# Patient Record
Sex: Female | Born: 1975 | Hispanic: Yes | Marital: Married | Smoking: Never smoker
Health system: Southern US, Community
[De-identification: ages and names within clinical notes are randomized; demographics above are authoritative.]

## PROBLEM LIST (undated history)

## (undated) DIAGNOSIS — J45909 Unspecified asthma, uncomplicated: Secondary | ICD-10-CM

## (undated) DIAGNOSIS — G43909 Migraine, unspecified, not intractable, without status migrainosus: Secondary | ICD-10-CM

## (undated) DIAGNOSIS — Z789 Other specified health status: Secondary | ICD-10-CM

## (undated) HISTORY — PX: CHOLECYSTECTOMY: SHX55

## (undated) HISTORY — DX: Migraine, unspecified, not intractable, without status migrainosus: G43.909

---
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
## 2004-03-27 ENCOUNTER — Emergency Department (HOSPITAL_COMMUNITY): Admission: EM | Admit: 2004-03-27 | Discharge: 2004-03-28 | Payer: Self-pay | Admitting: Emergency Medicine

## 2004-06-30 ENCOUNTER — Emergency Department (HOSPITAL_COMMUNITY): Admission: EM | Admit: 2004-06-30 | Discharge: 2004-06-30 | Payer: Self-pay | Admitting: Emergency Medicine

## 2004-07-05 ENCOUNTER — Emergency Department (HOSPITAL_COMMUNITY): Admission: EM | Admit: 2004-07-05 | Discharge: 2004-07-05 | Payer: Self-pay | Admitting: Emergency Medicine

## 2004-08-01 ENCOUNTER — Ambulatory Visit (HOSPITAL_COMMUNITY): Admission: RE | Admit: 2004-08-01 | Discharge: 2004-08-01 | Payer: Self-pay | Admitting: Internal Medicine

## 2004-08-02 ENCOUNTER — Ambulatory Visit (HOSPITAL_COMMUNITY): Admission: RE | Admit: 2004-08-02 | Discharge: 2004-08-02 | Payer: Self-pay | Admitting: Internal Medicine

## 2004-08-15 ENCOUNTER — Ambulatory Visit (HOSPITAL_COMMUNITY): Admission: RE | Admit: 2004-08-15 | Discharge: 2004-08-15 | Payer: Self-pay | Admitting: General Surgery

## 2009-09-27 ENCOUNTER — Ambulatory Visit (HOSPITAL_COMMUNITY): Admission: RE | Admit: 2009-09-27 | Discharge: 2009-09-27 | Payer: Self-pay | Admitting: Obstetrics and Gynecology

## 2011-05-17 NOTE — Op Note (Signed)
NAME:  [name], [name]                         ACCOUNT NO.:  [id number]   MEDICAL RECORD NO.:  [id number]                   PATIENT TYPE:  AMB   LOCATION:  DAY                                  FACILITY:  [facility]   PHYSICIAN:  Dalia Heading, M.D.               DATE OF BIRTH:  [birth year]   DATE OF PROCEDURE:  08/15/2004  DATE OF DISCHARGE:                                 OPERATIVE REPORT   PREOPERATIVE DIAGNOSIS:  Cholecystitis, cholelithiasis.   POSTOPERATIVE DIAGNOSIS:  Cholecystitis, cholelithiasis.   PROCEDURE:  Laparoscopic cholecystectomy.   SURGEON:  Dalia Heading, M.D.   ANESTHESIA:  General endotracheal.   INDICATIONS FOR PROCEDURE:  The patient is a 35 year old Hispanic female who  presents with cholecystitis secondary to cholelithiasis.  The risks and  benefits of the procedure, including bleeding, infection, hepatobiliary  injury, and the possibility of an open procedure were fully explained to the  patient who gave informed consent.   DESCRIPTION OF PROCEDURE:  The patient was placed in the supine position.  After induction of general endotracheal anesthesia, the abdomen was prepped  and draped using the usual sterile technique with Betadine.  Surgical site  confirmation was performed.   An infraumbilical incision was made down to the fascia.  A Veress needle was  introduced into the abdominal cavity, and confirmation of placement was done  using the saline drop test.  The abdomen was then insufflated to 16 mmHg  pressure.  An 11-mm trocar was introduced into the abdominal cavity under  direct visualization without difficulty.  The patient was placed in reverse  Trendelenburg position, and an additional 11-mm trocar was placed in the  epigastric region and 5-mm trocars were placed in the right upper quadrant  and right flank regions.  The liver was inspected and noted to be within  normal limits.  The gallbladder was retracted superiorly and laterally.  The  dissection was begun around the infundibulum of the gallbladder.  The cystic  duct was first identified.  Its juncture to the infundibulum was fully  identified.  Endoclips were placed proximally and distally on the cystic  duct, and the cystic duct was divided.  This was likewise done on the cystic  artery.  The gallbladder was then freed away from the gallbladder fossa  using Bovie electrocautery.  The gallbladder was delivered through the  epigastric trocar site using an EndoCatch bag.  The gallbladder fossa was  inspected, and no abnormal bleeding or bile leakage was noted.  Surgicel was  placed in the gallbladder fossa.  All fluid and air were then evacuated from  the abdominal cavity prior to removal of the trocars.   All wounds were irrigated with normal saline.  All wounds were injected with  0.5% Sensorcaine.  The infraumbilical fascia was reapproximated using an 0  Vicryl interrupted suture.  All skin incisions were closed using staples.  Betadine ointment and dry  sterile dressings were applied.   All tape and needle counts were correct at the end of the procedure.  The  patient was extubated in the operating room and went back to the recovery  room awake and in stable condition.   COMPLICATIONS:  None.   SPECIMENS:  Gallbladder with stones.   ESTIMATED BLOOD LOSS:  Minimal.      ___________________________________________                                            Dalia Heading, M.D.   MAJ/MEDQ  D:  08/15/2004  T:  08/15/2004  Job:  [id number]   cc:   Lionel December, M.D.  [address]  [place]  [place] [address]  Fax: [phone]

## 2011-05-17 NOTE — Op Note (Signed)
NAME:  [name], [name]                         ACCOUNT NO.:  [id number]   MEDICAL RECORD NO.:  [id number]                   PATIENT TYPE:  AMB   LOCATION:  DAY                                  FACILITY:  [facility]   PHYSICIAN:  Lionel December, M.D.                 DATE OF BIRTH:  [birth year]   DATE OF PROCEDURE:  08/01/2004  DATE OF DISCHARGE:                                 OPERATIVE REPORT   PROCEDURE:  Esophagogastroduodenoscopy.   INDICATIONS FOR PROCEDURE:  [name] is a 35 year old Hispanic female with a  four-month history of intermittent epigastric and retrosternal chest pain  who has not responded to PPI.  She has been to the ER multiple times and has  had negative ECGs and troponin levels.  She is undergoing diagnostic EGD.  The procedure risks were reviewed with the patient.  The patient does not  speak English; therefore, her husband helped with the translation.   PREOPERATIVE MEDICATIONS:  Cetacaine spray for pharyngeal topical  anesthesia, Demerol 25 mg IV, Versed 5 mg IV.   FINDINGS:  The procedure was performed in the endoscopy suite.  The  patient's vital signs and O2 saturations were monitored during the procedure  and remained stable.  The patient was placed in the left lateral recumbent  position, and the Olympus videoscope was passed via the oropharynx without  any difficulty into the esophagus.   Esophagus:  The mucosa of the esophagus was normal.  The gastroesophageal  junction was unremarkable.   Stomach:  It was empty and distended very well with insufflation.  There was  patchy erythema at the body and antrum, but no erosions or ulcers were  noted.  The pyloric channel was patent.  The angularis, fundus, and cardia  were examined by retroflexing the scope and were normal.   Duodenum:  Examination of the bulb revealed normal mucosa.  The scope was  passed to the second part of the duodenum where the mucosa and folds were  normal.  The endoscope was withdrawn.  The  patient tolerated the procedure  well.   FINAL DIAGNOSIS:  Nonerosive gastritis which possibly has nothing to do with  her pain.   PLAN:  1. Will check on the results of LFTs which are still pending  2. H pylori serology.  3. Levsin/SL t.i.d. p.r.n.  4. Upper abdominal ultrasound in the near future.      ___________________________________________                                            Lionel December, M.D.   NR/MEDQ  D:  08/01/2004  T:  08/02/2004  Job:  [id number]   cc:   [facility]

## 2011-05-17 NOTE — H&P (Signed)
NAME:  [name], [name]                         ACCOUNT NO.:  [id number]   MEDICAL RECORD NO.:  [id number]                   PATIENT TYPE:  OUT   LOCATION:  RAD                                  FACILITY:  [facility]   PHYSICIAN:  Dalia Heading, M.D.               DATE OF BIRTH:  [birth year]   DATE OF ADMISSION:  08/02/2004  DATE OF DISCHARGE:  08/02/2004                                HISTORY & PHYSICAL   CHIEF COMPLAINT:  Biliary colic, cholelithiasis.   HISTORY OF PRESENT ILLNESS:  This patient is a 35 year old Hispanic female  who was referred for evaluation and treatment of biliary colic secondary to  cholelithiasis.  She has been having intermittent episodes of right upper  quadrant abdominal pain with radiation to the right flank, nausea, and  bloating for several weeks.  She does have fatty food intolerance.  No  fever, chills, or jaundice have been noted.  The symptoms seem to be  worsening.   PAST MEDICAL HISTORY:  Unremarkable.   PAST SURGICAL HISTORY:  Unremarkable.   CURRENT MEDICATIONS:  Hyoscyamine.   ALLERGIES:  DARVOCET.   REVIEW OF SYSTEMS:  Noncontributory.   PHYSICAL EXAMINATION:  GENERAL:  The patient is a well-developed, well-  nourished Hispanic female in no acute distress.  VITAL SIGNS:  She is afebrile, and vital signs are stable.  HEENT:  No scleral icterus.  LUNGS:  Clear to auscultation with equal breath sounds bilaterally.  HEART:  Regular rate and rhythm without S3, S4, or murmurs.  ABDOMEN:  Soft with tenderness noted in the right upper quadrant to  palpation.  No hepatosplenomegaly, masses, or hernias are identified.  Ultrasound of the gallbladder reveals cholelithiasis with normal common bile  duct.   IMPRESSION:  Cholecystitis, cholelithiasis.   PLAN:  The patient is scheduled to undergo laparoscopic cholecystectomy on  08/15/2004.  The risks and benefits of the procedure, including bleeding,  infection, hepatobiliary injury, and the possibility  of an open procedure  were fully explained to the patient through her husband who was then  interpreter for the patient, who gave informed consent.     ___________________________________________                                         Dalia Heading, M.D.   MAJ/MEDQ  D:  08/09/2004  T:  08/09/2004  Job:  [id number]   cc:   Lionel December, M.D.  [address]  [place]  [place] [address]  Fax: [phone]

## 2011-05-17 NOTE — Consult Note (Signed)
[name], [name]                           ACCOUNT NO.:  [id number]   MEDICAL RECORD NO.:  [id number]                  PATIENT TYPE:   LOCATION:                                       FACILITY:   PHYSICIAN:  Lionel December, M.D.                 DATE OF BIRTH:  1976/04/07   DATE OF CONSULTATION:  07/27/2004  DATE OF DISCHARGE:                                   CONSULTATION   REFERRING PHYSICIAN:  [facility].   REASON FOR CONSULTATION:  Abdominal pain.   HISTORY OF PRESENT ILLNESS:  [name] is a 35 year old Hispanic female who  presents today at the request of [facility]  regarding abdominal pain.  She is accompanied by her husband, who translates  for her.  She has had epigastric pain over the last three to four months.  She has a burning type pain in her epigastrium, and this radiates up into  her chest and up into her ears and throat.  She also notes pain in her upper  back when this occurs.  This occurs especially with eating fruits,  vegetables, greasy foods and milk.  She has been woken up at night with the  pain.  The pain occurs postprandially.  She states that she has passed some  black stools, but no fresh blood.  Her bowels tend to go from diarrhea to  constipation.  She denies any hematuria or dysuria.  Last menstrual cycle  three weeks ago.  She states that she has been to the [facility] on  several occasions regarding these symptoms.  She has been to the emergency  department at [facility] on two occasions and once to [place].  Most  recently, at [facility], she was seen.  She denies having any x-rays  performed.  She had a MET-7 which revealed a slightly low potassium of 3.4.  CBC was normal.  She had CK MB, troponin, and myoglobin, which were all  normal as well.  She was Hemoccult negative on rectal examination.  The  patient has been on Aciphex for several months.  She was recently given a GI  cocktail.  She  states that this really has not helped her symptoms.  She  denies any NSAID or aspirin use recently, but was on ibuprofen back in March  for a musculoskeletal problem.   CURRENT MEDICATIONS:  None.   ALLERGIES:  HYDROCODONE, causes rash.   PAST MEDICAL HISTORY:  History of hypercholesterolemia, but not on any  medication.   FAMILY HISTORY:  Negative for chronic GI illnesses or colorectal cancer.   SOCIAL HISTORY:  She has been married for four years and has two children.  She is a housewife.  She has never been a smoker.  She denies any alcohol  use.   REVIEW OF SYSTEMS:  Please see HPI for GI, for GU.  CARDIOPULMONARY:  She  denies  any shortness of breath or diaphoresis along with this pain.   PHYSICAL EXAMINATION:  VITAL SIGNS:  Weight 140.5; height 5 feet 1 inch;  blood pressure 100/60; pulse 70.  GENERAL:  Pleasant, well-nourished, well-developed, Hispanic female in no  acute distress.  SKIN:  Warm and dry.  No jaundice.  HEENT:  Pupils equal, round and reactive to light.  Conjunctivae are pink.  Sclerae are nonicteric.  Oropharyngeal mucosa moist and pink.  No lesions,  erythema or exudate.  No lymphadenopathy, thyromegaly.  CHEST:  Lungs are clear to auscultation.  CARDIOVASCULAR:  Regular rate and rhythm, normal S1, S2, no murmurs, rubs or  gallops.  ABDOMEN:  Positive bowel sounds, soft, nondistended.  She has mild  epigastric tenderness to deep palpation.  No organomegaly or masses.  EXTREMITIES:  No edema.   IMPRESSION:  [name] is a pleasant 35 year old Hispanic female who presents  with a four month history of epigastric pain along with reflux type  symptoms.  She reports melena.  However, has been Hemoccult negative by  rectal examination in the emergency department.  Hemoglobin was normal at  that time as well.  Given her symptoms, would suspect that she most likely  has poorly controlled reflux disease or gastritis.  We cannot rule out  peptic ulcer disease or  even biliary etiology, however.   PLAN:  1. EGD in the near future.  2. Protonix 40 mg p.o. q.d.; two weeks of samples given.  3. We will obtain a set of LFTs at the time of endoscopy.     ____________________  Lionel December, M.D.     ____________________  Tana Coast, P.A.     ________________________________________  ___________________________________________  Tana Coast, P.A.                         Lionel December, M.D.   LL/MEDQ  D:  07/27/2004  T:  07/27/2004  Job:  [id number]   cc:   Lionel December, M.D.  [address]  [place]  [place] [address]  Fax: [phone]   [facility] Department

## 2011-09-25 ENCOUNTER — Ambulatory Visit
Admission: RE | Admit: 2011-09-25 | Discharge: 2011-09-25 | Disposition: A | Discharge status: Home / Self Care | Payer: No Typology Code available for payment source | Source: Ambulatory Visit | Attending: Geriatric Medicine | Admitting: Geriatric Medicine

## 2011-09-25 ENCOUNTER — Other Ambulatory Visit: Payer: Self-pay | Admitting: Geriatric Medicine

## 2011-09-25 DIAGNOSIS — R52 Pain, unspecified: Secondary | ICD-10-CM

## 2015-02-14 ENCOUNTER — Encounter (HOSPITAL_BASED_OUTPATIENT_CLINIC_OR_DEPARTMENT_OTHER): Payer: Self-pay | Admitting: Emergency Medicine

## 2015-02-14 DIAGNOSIS — Z9049 Acquired absence of other specified parts of digestive tract: Secondary | ICD-10-CM | POA: Insufficient documentation

## 2015-02-14 DIAGNOSIS — E876 Hypokalemia: Secondary | ICD-10-CM | POA: Insufficient documentation

## 2015-02-14 DIAGNOSIS — R109 Unspecified abdominal pain: Secondary | ICD-10-CM | POA: Insufficient documentation

## 2015-02-14 DIAGNOSIS — Z3202 Encounter for pregnancy test, result negative: Secondary | ICD-10-CM | POA: Insufficient documentation

## 2015-02-14 DIAGNOSIS — R197 Diarrhea, unspecified: Secondary | ICD-10-CM | POA: Insufficient documentation

## 2015-02-14 DIAGNOSIS — R112 Nausea with vomiting, unspecified: Secondary | ICD-10-CM | POA: Insufficient documentation

## 2015-02-14 DIAGNOSIS — F419 Anxiety disorder, unspecified: Secondary | ICD-10-CM | POA: Insufficient documentation

## 2015-02-14 DIAGNOSIS — R1912 Hyperactive bowel sounds: Secondary | ICD-10-CM | POA: Insufficient documentation

## 2015-02-14 NOTE — ED Notes (Signed)
Husband states that pt is having V/D since yesterday and started having SOB and can't feel her right arm.  Pt able to squeeze hands and states she feels me touching it.

## 2015-02-15 ENCOUNTER — Emergency Department (HOSPITAL_BASED_OUTPATIENT_CLINIC_OR_DEPARTMENT_OTHER)
Admission: EM | Admit: 2015-02-15 | Discharge: 2015-02-15 | Disposition: A | Discharge status: Home / Self Care | Payer: Self-pay | Attending: Emergency Medicine | Admitting: Emergency Medicine

## 2015-02-15 ENCOUNTER — Encounter (HOSPITAL_BASED_OUTPATIENT_CLINIC_OR_DEPARTMENT_OTHER): Payer: Self-pay | Admitting: Emergency Medicine

## 2015-02-15 ENCOUNTER — Emergency Department (HOSPITAL_BASED_OUTPATIENT_CLINIC_OR_DEPARTMENT_OTHER): Payer: Self-pay

## 2015-02-15 DIAGNOSIS — R112 Nausea with vomiting, unspecified: Secondary | ICD-10-CM

## 2015-02-15 DIAGNOSIS — E876 Hypokalemia: Secondary | ICD-10-CM

## 2015-02-15 DIAGNOSIS — R197 Diarrhea, unspecified: Secondary | ICD-10-CM

## 2015-02-15 DIAGNOSIS — R52 Pain, unspecified: Secondary | ICD-10-CM

## 2015-02-15 LAB — COMPREHENSIVE METABOLIC PANEL
ALT: 30 U/L (ref 0–35)
AST: 36 U/L (ref 0–37)
Albumin: 4.2 g/dL (ref 3.5–5.2)
Alkaline Phosphatase: 63 U/L (ref 39–117)
Anion gap: 4 — ABNORMAL LOW (ref 5–15)
BUN: 17 mg/dL (ref 6–23)
CO2: 21 mmol/L (ref 19–32)
Calcium: 8.3 mg/dL — ABNORMAL LOW (ref 8.4–10.5)
Chloride: 107 mmol/L (ref 96–112)
Creatinine, Ser: 0.4 mg/dL — ABNORMAL LOW (ref 0.50–1.10)
GFR calc Af Amer: >90 mL/min (ref >90)
GFR calc non Af Amer: >90 mL/min (ref >90)
Glucose, Bld: 136 mg/dL — ABNORMAL HIGH (ref 70–99)
Potassium: 2.9 mmol/L — ABNORMAL LOW (ref 3.5–5.1)
Sodium: 132 mmol/L — ABNORMAL LOW (ref 135–145)
Total Bilirubin: 1 mg/dL (ref 0.3–1.2)
Total Protein: 7.4 g/dL (ref 6.0–8.3)

## 2015-02-15 LAB — URINALYSIS, ROUTINE W REFLEX MICROSCOPIC
Glucose, UA: NEGATIVE mg/dL
Hgb urine dipstick: NEGATIVE
Ketones, ur: 15 mg/dL — AB
Nitrite: NEGATIVE
Protein, ur: NEGATIVE mg/dL
Specific Gravity, Urine: 1.03 (ref 1.005–1.030)
Urobilinogen, UA: 1 mg/dL (ref 0.0–1.0)
pH: 6 (ref 5.0–8.0)

## 2015-02-15 LAB — CBC WITH DIFFERENTIAL/PLATELET
Basophils Absolute: 0 10*3/uL (ref 0.0–0.1)
Basophils Relative: 0 % (ref 0–1)
Eosinophils Absolute: 0 10*3/uL (ref 0.0–0.7)
Eosinophils Relative: 1 % (ref 0–5)
HCT: 40.7 % (ref 36.0–46.0)
Hemoglobin: 13.7 g/dL (ref 12.0–15.0)
Lymphocytes Relative: 13 % (ref 12–46)
Lymphs Abs: 0.8 10*3/uL (ref 0.7–4.0)
MCH: 31.1 pg (ref 26.0–34.0)
MCHC: 33.7 g/dL (ref 30.0–36.0)
MCV: 92.5 fL (ref 78.0–100.0)
Monocytes Absolute: 0.5 10*3/uL (ref 0.1–1.0)
Monocytes Relative: 7 % (ref 3–12)
Neutro Abs: 5 10*3/uL (ref 1.7–7.7)
Neutrophils Relative %: 79 % — ABNORMAL HIGH (ref 43–77)
Platelets: 184 10*3/uL (ref 150–400)
RBC: 4.4 MIL/uL (ref 3.87–5.11)
RDW: 13.3 % (ref 11.5–15.5)
WBC: 6.3 10*3/uL (ref 4.0–10.5)

## 2015-02-15 LAB — LIPASE, BLOOD: Lipase: 25 U/L (ref 11–59)

## 2015-02-15 LAB — URINE MICROSCOPIC-ADD ON

## 2015-02-15 LAB — PREGNANCY, URINE: Preg Test, Ur: NEGATIVE

## 2015-02-15 MED ORDER — IOHEXOL 300 MG/ML  SOLN
25.0000 mL | Freq: Once | INTRAMUSCULAR | Status: AC | PRN
  Administered 2015-02-15: 25 mL via ORAL

## 2015-02-15 MED ORDER — IOHEXOL 300 MG/ML  SOLN
100.0000 mL | Freq: Once | INTRAMUSCULAR | Status: AC | PRN
  Administered 2015-02-15: 100 mL via INTRAVENOUS

## 2015-02-15 MED ORDER — KETOROLAC TROMETHAMINE 30 MG/ML IJ SOLN
30.0000 mg | Freq: Once | INTRAMUSCULAR | Status: AC
  Administered 2015-02-15: 30 mg via INTRAVENOUS
  Filled 2015-02-15: qty 1

## 2015-02-15 MED ORDER — SODIUM CHLORIDE 0.9 % IV SOLN
Freq: Once | INTRAVENOUS | Status: AC
  Administered 2015-02-15: 02:00:00 via INTRAVENOUS

## 2015-02-15 MED ORDER — GI COCKTAIL ~~LOC~~
30.0000 mL | Freq: Once | ORAL | Status: AC
  Administered 2015-02-15: 30 mL via ORAL
  Filled 2015-02-15: qty 30

## 2015-02-15 MED ORDER — SODIUM CHLORIDE 0.9 % IV BOLUS (SEPSIS)
1000.0000 mL | Freq: Once | INTRAVENOUS | Status: AC
  Administered 2015-02-15: 1000 mL via INTRAVENOUS

## 2015-02-15 MED ORDER — POTASSIUM CHLORIDE CRYS ER 20 MEQ PO TBCR
60.0000 meq | EXTENDED_RELEASE_TABLET | Freq: Once | ORAL | Status: AC
Start: 2015-02-15 — End: 2015-02-15
  Administered 2015-02-15: 60 meq via ORAL
  Filled 2015-02-15: qty 3

## 2015-02-15 MED ORDER — PROMETHAZINE HCL 12.5 MG RE SUPP: 12.5000 mg | Freq: Three times a day (TID) | RECTAL | Status: DC | PRN

## 2015-02-15 MED ORDER — POTASSIUM CHLORIDE 10 MEQ/100ML IV SOLN
10.0000 meq | INTRAVENOUS | Status: AC
  Administered 2015-02-15 (×2): 10 meq via INTRAVENOUS
  Filled 2015-02-15 (×2): qty 100

## 2015-02-15 MED ORDER — ONDANSETRON HCL 4 MG/2ML IJ SOLN
INTRAMUSCULAR | Status: AC
Start: 2015-02-15 — End: 2015-02-15
  Administered 2015-02-15: 4 mg
  Filled 2015-02-15: qty 2

## 2015-02-15 MED ORDER — POTASSIUM CHLORIDE CRYS ER 20 MEQ PO TBCR: 20.0000 meq | EXTENDED_RELEASE_TABLET | Freq: Two times a day (BID) | ORAL | Status: DC

## 2015-02-15 NOTE — ED Provider Notes (Signed)
CSN: [id number]     Arrival date & time 02/14/15  2028 History   First MD Initiated Contact with Patient 02/15/15 0032     Chief Complaint  Patient presents with  . Emesis  . Diarrhea  . Shortness of Breath     (Consider location/radiation/quality/duration/timing/severity/associated sxs/prior Treatment) Patient is a 39 y.o. female presenting with vomiting. The history is provided by the patient.  Emesis Severity:  Severe Duration:  1 day Timing:  Intermittent Quality:  Stomach contents Progression:  Unchanged Chronicity:  New Recent urination:  Normal Relieved by:  Nothing Worsened by:  Nothing tried Ineffective treatments:  None tried Associated symptoms: abdominal pain and diarrhea   Diarrhea:    Quality:  Watery   Severity:  Moderate   Timing:  Intermittent   Progression:  Unchanged Risk factors: sick contacts   Risk factors: no diabetes   epigastric pain, and hand cramping and panic attack  History reviewed. No pertinent past medical history. Past Surgical History  Procedure Laterality Date  . Cholecystectomy     History reviewed. No pertinent family history. History  Substance Use Topics  . Smoking status: Never Smoker   . Smokeless tobacco: Not on file  . Alcohol Use: No   OB History    No data available     Review of Systems  Constitutional: Negative for fever.  Respiratory: Negative for shortness of breath.   Cardiovascular: Negative for chest pain, palpitations and leg swelling.  Gastrointestinal: Positive for nausea, vomiting, abdominal pain and diarrhea.  All other systems reviewed and are negative.     Allergies  Hydrocodone  Home Medications   Prior to Admission medications   Not on File   BP 88/71 mmHg  Pulse 80  Temp(Src) 98.9 F (37.2 C) (Oral)  Resp 20  Ht  (1.626 m)  Wt 160 lb (72.576 kg)  BMI 27.45 kg/m2  SpO2 99%  LMP 02/11/2015 Physical Exam  Constitutional: She is oriented to person, place, and time. She appears  well-developed and well-nourished. No distress.  HENT:  Head: Normocephalic and atraumatic.  Mouth/Throat: Oropharynx is clear and moist.  Eyes: Conjunctivae are normal. Pupils are equal, round, and reactive to light.  Neck: Normal range of motion. Neck supple.  Cardiovascular: Normal rate, regular rhythm and intact distal pulses.   Pulmonary/Chest: Effort normal. No respiratory distress. She has no wheezes. She has no rales.  Abdominal: Soft. Bowel sounds are increased. There is no tenderness. There is no rigidity, no rebound, no guarding, no tenderness at McBurney's point and negative Murphy's sign.  Musculoskeletal: Normal range of motion.  Neurological: She is alert and oriented to person, place, and time.  Skin: Skin is warm and dry.  Psychiatric: Her mood appears anxious.    ED Course  Procedures (including critical care time) Labs Review Labs Reviewed  COMPREHENSIVE METABOLIC PANEL - Abnormal; Notable for the following:    Sodium 132 (*)    Potassium 2.9 (*)    Glucose, Bld 136 (*)    Creatinine, Ser 0.40 (*)    Calcium 8.3 (*)    Anion gap 4 (*)    All other components within normal limits  PREGNANCY, URINE  LIPASE, BLOOD    Imaging Review No results found.   EKG Interpretation   Date/Time:  Wednesday February 15 2015 00:58:50 EST Ventricular Rate:  73 PR Interval:  160 QRS Duration: 82 QT Interval:  440 QTC Calculation: 484 R Axis:   -5 Text Interpretation:  Normal sinus rhythm  Confirmed by [facility]  MD,  Morene AntuAPRIL ([id number]) on 02/15/2015 1:04:46 AM      MDM   Final diagnoses:  None    Results for orders placed or performed during the hospital encounter of 02/15/15  Pregnancy, urine  Result Value Ref Range   Preg Test, Ur NEGATIVE NEGATIVE  Comprehensive metabolic panel  Result Value Ref Range   Sodium 132 (L) 135 - 145 mmol/L   Potassium 2.9 (L) 3.5 - 5.1 mmol/L   Chloride 107 96 - 112 mmol/L   CO2 21 19 - 32 mmol/L   Glucose, Bld 136 (H) 70 -  99 mg/dL   BUN 17 6 - 23 mg/dL   Creatinine, Ser 6.040.40 (L) 0.50 - 1.10 mg/dL   Calcium 8.3 (L) 8.4 - 10.5 mg/dL   Total Protein 7.4 6.0 - 8.3 g/dL   Albumin 4.2 3.5 - 5.2 g/dL   AST 36 0 - 37 U/L   ALT 30 0 - 35 U/L   Alkaline Phosphatase 63 39 - 117 U/L   Total Bilirubin 1.0 0.3 - 1.2 mg/dL   GFR calc non Af Amer >90 >90 mL/min   GFR calc Af Amer >90 >90 mL/min   Anion gap 4 (L) 5 - 15  Lipase, blood  Result Value Ref Range   Lipase 25 11 - 59 U/L  Urinalysis, Routine w reflex microscopic  Result Value Ref Range   Color, Urine AMBER (A) YELLOW   APPearance CLOUDY (A) CLEAR   Specific Gravity, Urine 1.030 1.005 - 1.030   pH 6.0 5.0 - 8.0   Glucose, UA NEGATIVE NEGATIVE mg/dL   Hgb urine dipstick NEGATIVE NEGATIVE   Bilirubin Urine SMALL (A) NEGATIVE   Ketones, ur 15 (A) NEGATIVE mg/dL   Protein, ur NEGATIVE NEGATIVE mg/dL   Urobilinogen, UA 1.0 0.0 - 1.0 mg/dL   Nitrite NEGATIVE NEGATIVE   Leukocytes, UA TRACE (A) NEGATIVE  CBC with Differential/Platelet  Result Value Ref Range   WBC 6.3 4.0 - 10.5 K/uL   RBC 4.40 3.87 - 5.11 MIL/uL   Hemoglobin 13.7 12.0 - 15.0 g/dL   HCT 54.040.7 98.136.0 - 19.146.0 %   MCV 92.5 78.0 - 100.0 fL   MCH 31.1 26.0 - 34.0 pg   MCHC 33.7 30.0 - 36.0 g/dL   RDW 47.813.3 29.511.5 - 62.115.5 %   Platelets 184 150 - 400 K/uL   Neutrophils Relative % 79 (H) 43 - 77 %   Neutro Abs 5.0 1.7 - 7.7 K/uL   Lymphocytes Relative 13 12 - 46 %   Lymphs Abs 0.8 0.7 - 4.0 K/uL   Monocytes Relative 7 3 - 12 %   Monocytes Absolute 0.5 0.1 - 1.0 K/uL   Eosinophils Relative 1 0 - 5 %   Eosinophils Absolute 0.0 0.0 - 0.7 K/uL   Basophils Relative 0 0 - 1 %   Basophils Absolute 0.0 0.0 - 0.1 K/uL  Urine microscopic-add on  Result Value Ref Range   Squamous Epithelial / LPF FEW (A) RARE   WBC, UA 3-6 <3 WBC/hpf   RBC / HPF 0-2 <3 RBC/hpf   Bacteria, UA FEW (A) RARE   Urine-Other AMORPHOUS URATES/PHOSPHATES    Ct Abdomen Pelvis W Contrast  02/15/2015   CLINICAL DATA:  Nausea  vomiting and abdominal pain  EXAM: CT ABDOMEN AND PELVIS WITH CONTRAST  TECHNIQUE: Multidetector CT imaging of the abdomen and pelvis was performed using the standard protocol following bolus administration of intravenous contrast.  CONTRAST:  25mL  OMNIPAQUE IOHEXOL 300 MG/ML SOLN, OMNIPAQUE IOHEXOL 300 MG/ML SOLN  COMPARISON:  None.  FINDINGS: Lower chest: Minimal linear scarring or atelectasis in both lung bases  Hepatobiliary: Cholecystectomy.  Normal liver and bile ducts.  Pancreas: Normal  Spleen: Normal  Adrenals/Urinary Tract: The adrenals and kidneys are normal in appearance. There is no urinary calculus evident. There is no hydronephrosis or ureteral dilatation. Collecting systems and ureters appear unremarkable.  Stomach/Bowel: The stomach, small bowel and colon are unremarkable except for the presence of liquid stool throughout much of the colon. There is no obstruction. There is no extraluminal air. There is no focal inflammatory change. The appendix is normal.  Vascular/Lymphatic: The abdominal aorta is normal in caliber. There is no atherosclerotic calcification. There is no adenopathy in the abdomen or pelvis.  Other: There is no ascites.  Uterus and ovaries appear unremarkable.  Musculoskeletal: No significant abnormality.  IMPRESSION: No significant abnormality.   Electronically Signed   By: Ellery Plunk M.D.   On: 02/15/2015 05:16   Dg Abd Acute W/chest  02/15/2015   CLINICAL DATA:  Nausea vomiting diarrhea and abdominal pain  EXAM: ACUTE ABDOMEN SERIES (ABDOMEN 2 VIEW & CHEST 1 VIEW)  COMPARISON:  None.  FINDINGS: There is a generous volume of air throughout the minimally dilated small bowel, more likely nonobstructive. Stool and air is scattered throughout the colon. There is no free intraperitoneal air. Right upper quadrant surgical clips noted. The upright view of the chest is negative for acute cardiopulmonary abnormality.  IMPRESSION: Mildly prominent air-filled small bowel,  more likely nonobstructive. Enteritis may produce this appearance.   Electronically Signed   By: Ellery Plunk M.D.   On: 02/15/2015 01:56    Medications  ondansetron (ZOFRAN) 4 MG/2ML injection (4 mg  Given 02/15/15 0049)  sodium chloride 0.9 % bolus 1,000 mL (0 mLs Intravenous Stopped 02/15/15 0204)  gi cocktail (Maalox,Lidocaine,Donnatal) (30 mLs Oral Given 02/15/15 0119)  potassium chloride 10 mEq in 100 mL IVPB (0 mEq Intravenous Stopped 02/15/15 0503)  potassium chloride SA (K-DUR,KLOR-CON) CR tablet 60 mEq (60 mEq Oral Given 02/15/15 0205)  0.9 %  sodium chloride infusion ( Intravenous Stopped 02/15/15 0322)  sodium chloride 0.9 % bolus 1,000 mL (0 mLs Intravenous Stopped 02/15/15 0504)  ketorolac (TORADOL) 30 MG/ML injection 30 mg (30 mg Intravenous Given 02/15/15 0321)  iohexol (OMNIPAQUE) 300 MG/ML solution 25 mL (25 mLs Oral Contrast Given 02/15/15 0455)  iohexol (OMNIPAQUE) 300 MG/ML solution 100 mL (100 mLs Intravenous Contrast Given 02/15/15 0459)   Viral enteritis, take all potassium.  phenergan suppositories prn.  Strict return precautions   Anhelica Fowers K Adwoa Axe-Rasch, MD 02/15/15 [phone]

## 2015-08-07 ENCOUNTER — Ambulatory Visit (HOSPITAL_COMMUNITY): Payer: Self-pay

## 2015-08-09 ENCOUNTER — Other Ambulatory Visit (HOSPITAL_COMMUNITY): Payer: Self-pay | Admitting: *Deleted

## 2015-08-09 DIAGNOSIS — N6320 Unspecified lump in the left breast, unspecified quadrant: Secondary | ICD-10-CM

## 2015-08-15 ENCOUNTER — Ambulatory Visit
Admission: RE | Admit: 2015-08-15 | Discharge: 2015-08-15 | Disposition: A | Discharge status: Home / Self Care | Payer: No Typology Code available for payment source | Source: Ambulatory Visit | Attending: Obstetrics and Gynecology | Admitting: Obstetrics and Gynecology

## 2015-08-15 ENCOUNTER — Other Ambulatory Visit (HOSPITAL_COMMUNITY): Payer: Self-pay | Admitting: *Deleted

## 2015-08-15 ENCOUNTER — Ambulatory Visit (HOSPITAL_COMMUNITY)
Admission: RE | Admit: 2015-08-15 | Discharge: 2015-08-15 | Disposition: A | Discharge status: Home / Self Care | Payer: Self-pay | Source: Ambulatory Visit | Attending: Obstetrics and Gynecology | Admitting: Obstetrics and Gynecology

## 2015-08-15 ENCOUNTER — Encounter (HOSPITAL_COMMUNITY): Payer: Self-pay

## 2015-08-15 VITALS — BP 104/68 | Temp 97.6°F | Ht 64.0 in | Wt 166.0 lb

## 2015-08-15 DIAGNOSIS — N644 Mastodynia: Secondary | ICD-10-CM

## 2015-08-15 DIAGNOSIS — M79622 Pain in left upper arm: Secondary | ICD-10-CM

## 2015-08-15 DIAGNOSIS — N6320 Unspecified lump in the left breast, unspecified quadrant: Secondary | ICD-10-CM

## 2015-08-15 NOTE — Progress Notes (Signed)
CLINIC:   Breast [facility] Cervical [facility] Control [facility] Civil engineer, contracting) Clinic  REASON FOR VISIT: Well-woman exam  HISTORY OF PRESENT ILLNESS:   [name] is a 39 y.o. female who presents to the [facility] today for clinical breast exam. Interpreter, [name] was present. No history of a mammogram. Reports pain to her left axilla that radiated down to the breast. Had a left axillary lump, but no longer able to feel this. Has some pain in her left arm as well which she describes as a hot "burning" sensation. Rates pain 9/10 when moving arm. Her last pap smear was performed in July 2016 and was negative. No history of abnormal pap.   REVIEW OF SYSTEMS:   Denies nodularity, skin changes, nipple inversion, or nipple discharge bilaterally.  Denies any pelvic pain, pressure, or abnormal vaginal bleeding.   ALLERGIES: Allergies  Allergen Reactions  . Hydrocodone     MEDICATIONS: No current outpatient prescriptions on file.  PHYSICAL EXAM:   BP 104/68 mmHg  Temp(Src) 97.6 F (36.4 C) (Oral)  Ht  (1.626 m)  Wt 166 lb (75.297 kg)  BMI 28.48 kg/m2  LMP 08/11/2015 (Approximate)  General: Well-nourished, well-appearing female in no acute distress.  She is accompanied by an interpreter in clinic today.  Stoney Bang, LPN was present during physical exam for this patient.   Breasts: Bilateral breasts exposed and observed with patient standing (arms at side, arms on hips, arms on hips flexed forward, and arms over head).  No gross abnormalities including breast skin puckering or dimpling noted on observation.  Breasts symmetrical without evidence of skin redness, thickening, or peau d'orange appearance. No nipple retraction or nipple discharge noted bilaterally.  No breast nodularity palpated in bilateral breasts.. Axillary lymph nodes: No axillary lymphadenopathy bilaterally.     ASSESSMENT & PLAN:  1. Breast cancer screening: [name] has no palpable breast abnormalities on her clinical breast  exam today.  She will receive her diagnostic mammogram as scheduled.  She will be contacted by the imaging center for results of the mammogram. She was given instructions and educational materials regarding breast self-awareness. Ms. Toney Reil is aware of this plan and agrees with it.   2. Cervical cancer screening: [name] had a recent pap in July 2016. Pelvic exam was not performed today.  [name] was encouraged to ask questions and all questions were answered to her satisfaction.      Clenton Pare, DNP, AGPCNP-BC, [facility] [phone]

## 2015-11-15 NOTE — Addendum Note (Signed)
Encounter addended by: Christine P Brannock, RN on: 11/15/2015  4:36 PM<BR>     Documentation filed: Charges VN

## 2017-01-07 ENCOUNTER — Ambulatory Visit (INDEPENDENT_AMBULATORY_CARE_PROVIDER_SITE_OTHER): Payer: Self-pay | Admitting: Family Medicine

## 2017-01-07 VITALS — BP 122/78 | HR 78 | Temp 98.7°F | Resp 17 | Ht 64.0 in | Wt 185.0 lb

## 2017-01-07 DIAGNOSIS — J329 Chronic sinusitis, unspecified: Secondary | ICD-10-CM

## 2017-01-07 MED ORDER — PSEUDOEPH-BROMPHEN-DM 30-2-10 MG/5ML PO SYRP: 5.0000 mL | ORAL_SOLUTION | Freq: Four times a day (QID) | ORAL | 0 refills | Status: DC | PRN

## 2017-01-07 MED ORDER — AMOXICILLIN-POT CLAVULANATE 875-125 MG PO TABS: 1.0000 | ORAL_TABLET | Freq: Two times a day (BID) | ORAL | 0 refills | Status: DC

## 2017-01-07 NOTE — Patient Instructions (Addendum)
Start Augmentin 1 tablet twice daily with food to avoid stomach upset. Complete all medication.  Brompheniramine-pseudoephedrine-DM 30-2-10 MG/5ML.  Return for care if symptoms worsen or doesn't improve.   Inicie Augmentin 1 tableta dos veces al da con alimentos para Programme researcher, broadcasting/film/video. Completa todos los medicamentos.  Brompheniramina-pseudoefedrina-DM 30-2-10 MG / .  Regrese para recibir atencin si los sntomas empeoran o no mejoran.   IF you received an x-ray today, you will receive an invoice from [facility]. Please contact [facility] at [phone] with questions or concerns regarding your invoice.   IF you received labwork today, you will receive an invoice from [facility]. Please contact [facility] at [phone] with questions or concerns regarding your invoice.   Our billing staff will not be able to assist you with questions regarding bills from these companies.  You will be contacted with the lab results as soon as they are available. The fastest way to get your results is to activate your My Chart account. Instructions are located on the last page of this paperwork. If you have not heard from [place] regarding the results in 2 weeks, please contact this office.     Sinusitis, Adult Sinusitis is soreness and inflammation of your sinuses. Sinuses are hollow spaces in the bones around your face. They are located:  Around your eyes.  In the middle of your forehead.  Behind your nose.  In your cheekbones. Your sinuses and nasal passages are lined with a stringy fluid (mucus). Mucus normally drains out of your sinuses. When your nasal tissues get inflamed or swollen, the mucus can get trapped or blocked so air cannot flow through your sinuses. This lets bacteria, viruses, and funguses grow, and that leads to infection. Follow these instructions at home: Medicines  Take, use, or apply over-the-counter and prescription medicines only as told by  your doctor. These may include nasal sprays.  If you were prescribed an antibiotic medicine, take it as told by your doctor. Do not stop taking the antibiotic even if you start to feel better. Hydrate and Humidify  Drink enough water to keep your pee (urine) clear or pale yellow.  Use a cool mist humidifier to keep the humidity level in your home above 50%.  Breathe in steam for 10-15 minutes, 3-4 times a day or as told by your doctor. You can do this in the bathroom while a hot shower is running.  Try not to spend time in cool or dry air. Rest  Rest as much as possible.  Sleep with your head raised (elevated).  Make sure to get enough sleep each night. General instructions  Put a warm, moist washcloth on your face 3-4 times a day or as told by your doctor. This will help with discomfort.  Wash your hands often with soap and water. If there is no soap and water, use hand sanitizer.  Do not smoke. Avoid being around people who are smoking (secondhand smoke).  Keep all follow-up visits as told by your doctor. This is important. Contact a doctor if:  You have a fever.  Your symptoms get worse.  Your symptoms do not get better within 10 days. Get help right away if:  You have a very bad headache.  You cannot stop throwing up (vomiting).  You have pain or swelling around your face or eyes.  You have trouble seeing.  You feel confused.  Your neck is stiff.  You have trouble breathing. This information is not intended to replace advice  given to you by your health care provider. Make sure you discuss any questions you have with your health care provider. Document Released: 06/03/2008 Document Revised: 08/11/2016 Document Reviewed: 10/11/2015 Elsevier Interactive Patient Education  2017 [employer]

## 2017-01-07 NOTE — Progress Notes (Signed)
   Patient ID: [name], female    DOB: 1976-04-20, 41 y.o.   MRN: [id number]  PCP: No primary care provider on file.  Chief Complaint  Patient presents with  . Cough  . Sore Throat  . Back Pain  . URI    Subjective:  HPI  41 year old female presents for evaluation of upper respiratory symptoms x Started with cough ( productive) and sore throat. Headache bilateral and pain behind the ears. Shortness of breath at night while trying to sleep. Back pain related to coughing Medications taken Mucinex without relief of symptoms. She has remained afebrile.   Social History   Social History  . Marital status: Married    Spouse name: N/A  . Number of children: N/A  . Years of education: N/A   Occupational History  . Not on file.   Social History Main Topics  . Smoking status: Never Smoker  . Smokeless tobacco: Never Used  . Alcohol use No  . Drug use: No  . Sexual activity: Yes    Birth control/ protection: Condom   Other Topics Concern  . Not on file   Social History Narrative  . No narrative on file    Family History  Problem Relation Age of Onset  . Diabetes Father   . Hypertension Father      Review of Systems  There are no active problems to display for this patient.   Allergies  Allergen Reactions  . Hydrocodone     Prior to Admission medications   Medication Sig Start Date End Date Taking? Authorizing Provider  dextromethorphan-guaiFENesin (MUCINEX DM) 30-600 MG 12hr tablet Take 1 tablet by mouth 2 (two) times daily.   Yes Historical Provider, MD    Past Medical, Surgical Family and Social History reviewed and updated.    Objective:   Today's Vitals   01/07/17 1017  BP: 122/78  Pulse: 78  Resp: 17  Temp: 98.7 F (37.1 C)  TempSrc: Oral  SpO2: 97%  Weight: 185 lb (83.9 kg)  Height: 5\' 4"  (1.626 m)    Wt Readings from Last 3 Encounters:  01/07/17 185 lb (83.9 kg)  08/15/15 166 lb (75.3 kg)   Physical Exam  Constitutional:  She is oriented to person, place, and time. She appears well-developed and well-nourished.  HENT:  Head: Normocephalic and atraumatic.  Right Ear: Hearing, tympanic membrane, external ear and ear canal normal.  Left Ear: Hearing, tympanic membrane, external ear and ear canal normal.  Nose: Mucosal edema and rhinorrhea present.  Mouth/Throat: Uvula is midline. No oropharyngeal exudate, posterior oropharyngeal edema or posterior oropharyngeal erythema.  Neck: Normal range of motion. Neck supple.  Cardiovascular: Normal rate, regular rhythm and normal heart sounds.   Pulmonary/Chest: Effort normal and breath sounds normal.  Lymphadenopathy:    She has no cervical adenopathy.  Neurological: She is alert and oriented to person, place, and time.  Skin: Skin is warm and dry.  Psychiatric: She has a normal mood and affect. Her behavior is normal. Judgment and thought content normal.     Assessment & Plan:  1. Sinusitis, unspecified chronicity, unspecified location -Augmentin 875-125 mg one tablet twice daily x 10 days. -brompheniramine-pseudoephedrine-DM 30-2-10 MG/5ML   Return for care if symptoms worsen or do not improve.  Godfrey PickKimberly S. Tiburcio PeaHarris, MSN, FNP-C [facility] at [facility] [phone]

## 2017-01-15 ENCOUNTER — Encounter (HOSPITAL_BASED_OUTPATIENT_CLINIC_OR_DEPARTMENT_OTHER): Payer: Self-pay | Admitting: Emergency Medicine

## 2017-01-15 ENCOUNTER — Emergency Department (HOSPITAL_BASED_OUTPATIENT_CLINIC_OR_DEPARTMENT_OTHER): Payer: Self-pay

## 2017-01-15 ENCOUNTER — Emergency Department (HOSPITAL_BASED_OUTPATIENT_CLINIC_OR_DEPARTMENT_OTHER)
Admission: EM | Admit: 2017-01-15 | Discharge: 2017-01-15 | Disposition: A | Discharge status: Home / Self Care | Payer: Self-pay | Attending: Emergency Medicine | Admitting: Emergency Medicine

## 2017-01-15 DIAGNOSIS — B9789 Other viral agents as the cause of diseases classified elsewhere: Secondary | ICD-10-CM

## 2017-01-15 DIAGNOSIS — M549 Dorsalgia, unspecified: Secondary | ICD-10-CM | POA: Insufficient documentation

## 2017-01-15 DIAGNOSIS — Z79899 Other long term (current) drug therapy: Secondary | ICD-10-CM | POA: Insufficient documentation

## 2017-01-15 DIAGNOSIS — J40 Bronchitis, not specified as acute or chronic: Secondary | ICD-10-CM | POA: Insufficient documentation

## 2017-01-15 DIAGNOSIS — J069 Acute upper respiratory infection, unspecified: Secondary | ICD-10-CM | POA: Insufficient documentation

## 2017-01-15 MED ORDER — DM-GUAIFENESIN ER 30-600 MG PO TB12: 1.0000 | ORAL_TABLET | Freq: Two times a day (BID) | ORAL | 1 refills | Status: DC

## 2017-01-15 NOTE — Discharge Instructions (Signed)
Chest x-ray negative for pneumonia. Symptoms consistent with a upper respiratory infection bronchitis which would be viral. Antibiotics will not help. Give a trial of Mucinex DM it may help suppress the cough and loosen the phlegm. Return for any new or worse symptoms.

## 2017-01-15 NOTE — ED Provider Notes (Signed)
[facility] Provider Note   CSN: [id number] Arrival date & time: 01/15/17  [id number]     History   Chief Complaint Chief Complaint  Patient presents with  . Cough    HPI [name] is a 41 y.o. female.  Patient with a 2 week history of runny nose cough and congestion. Patient seen at urgent care treated with Augmentin but not getting any better. Patient does have some back pain with the cough. Denies any fevers nausea vomiting or diarrhea.      No past medical history on file.  There are no active problems to display for this patient.   Past Surgical History:  Procedure Laterality Date  . CHOLECYSTECTOMY      OB History    Gravida Para Term Preterm AB Living   4 4 4  0 0     SAB TAB Ectopic Multiple Live Births   0 0 0           Home Medications    Prior to Admission medications   Medication Sig Start Date End Date Taking? Authorizing Provider  amoxicillin-clavulanate (AUGMENTIN) 875-125 MG tablet Take 1 tablet by mouth 2 (two) times daily. 01/07/17   Doyle AskewKimberly Stephenia Harris, FNP  brompheniramine-pseudoephedrine-DM 30-2-10 MG/5ML syrup Take 5 mLs by mouth 4 (four) times daily as needed. 01/07/17   Doyle AskewKimberly Stephenia Harris, FNP  dextromethorphan-guaiFENesin [facility](MUCINEX DM) 30-600 MG 12hr tablet Take 1 tablet by mouth 2 (two) times daily. 01/15/17   Vanetta MuldersScott Breahna Boylen, MD    Family History Family History  Problem Relation Age of Onset  . Diabetes Father   . Hypertension Father     Social History Social History  Substance Use Topics  . Smoking status: Never Smoker  . Smokeless tobacco: Never Used  . Alcohol use No     Allergies   Hydrocodone and Hydrocodone   Review of Systems Review of Systems  Constitutional: Negative for fever.  HENT: Positive for congestion.   Eyes: Negative for redness.  Respiratory: Positive for cough. Negative for shortness of breath.   Gastrointestinal: Negative for abdominal pain, diarrhea, nausea and vomiting.    Musculoskeletal: Positive for back pain. Negative for neck stiffness.  Skin: Negative for rash.  Hematological: Does not bruise/bleed easily.  Psychiatric/Behavioral: Negative for confusion.     Physical Exam Updated Vital Signs BP 113/75 (BP Location: Left Arm)   Pulse 86   Temp 98.1 F (36.7 C) (Oral)   Resp 18   Ht 5\' 4"  (1.626 m)   Wt 82.6 kg   LMP 01/14/2017   SpO2 99%   BMI 31.26 kg/m   Physical Exam  Constitutional: She is oriented to person, place, and time. She appears well-developed and well-nourished. No distress.  HENT:  Head: Normocephalic and atraumatic.  Mouth/Throat: Oropharynx is clear and moist. No oropharyngeal exudate.  Eyes: Conjunctivae and EOM are normal. Pupils are equal, round, and reactive to light.  Neck: Normal range of motion. Neck supple.  Cardiovascular: Normal rate and regular rhythm.   Pulmonary/Chest: Effort normal and breath sounds normal. No respiratory distress.  Abdominal: Soft. Bowel sounds are normal. There is no tenderness.  Neurological: She is alert and oriented to person, place, and time. No cranial nerve deficit or sensory deficit. She exhibits normal muscle tone. Coordination normal.  Skin: Skin is warm. No erythema.  Nursing note and vitals reviewed.    ED Treatments / Results  Labs (all labs ordered are listed, but only abnormal results are displayed) Labs Reviewed -  No data to display  EKG  EKG Interpretation None       Radiology Dg Chest 2 View  Result Date: 01/15/2017 CLINICAL DATA:  Cough and congestion for 2 weeks EXAM: CHEST  2 VIEW COMPARISON:  None. FINDINGS: Lungs are clear. Heart size and pulmonary vascularity are normal. No adenopathy. No bone lesions. IMPRESSION: No edema or consolidation. Electronically Signed   By: Bretta Bang III M.D.   On: 01/15/2017 09:13    Procedures Procedures (including critical care time)  Medications Ordered in ED Medications - No data to display   Initial  Impression / Assessment and Plan / ED Course  I have reviewed the triage vital signs and the nursing notes.  Pertinent labs & imaging results that were available during my care of the patient were reviewed by me and considered in my medical decision making (see chart for details).  Clinical Course    Chest x-rays negative for pneumonia. Symptoms consistent with viral upper respiratory infection bronchitis. Will give patient a trial Mucinex DM. Patient nontoxic no acute distress.   Final Clinical Impressions(s) / ED Diagnoses   Final diagnoses:  Viral URI with cough  Bronchitis    New Prescriptions New Prescriptions   DEXTROMETHORPHAN-GUAIFENESIN (MUCINEX DM) 30-600 MG 12HR TABLET    Take 1 tablet by mouth 2 (two) times daily.     Vanetta Mulders, MD 01/15/17 [phone]

## 2017-01-15 NOTE — ED Triage Notes (Signed)
Pt having runny nose, cough congestion for over two weeks.  Pt seen at urgent care and was given Augmentin and cough syrup.  Pt continues to have a cough and back pain.

## 2017-01-15 NOTE — ED Notes (Signed)
Pt verbalized understanding of discharge instructions and denies any further questions at this time.   

## 2017-01-31 ENCOUNTER — Emergency Department (HOSPITAL_BASED_OUTPATIENT_CLINIC_OR_DEPARTMENT_OTHER): Payer: No Typology Code available for payment source

## 2017-01-31 ENCOUNTER — Encounter (HOSPITAL_BASED_OUTPATIENT_CLINIC_OR_DEPARTMENT_OTHER): Payer: Self-pay | Admitting: Emergency Medicine

## 2017-01-31 ENCOUNTER — Emergency Department (HOSPITAL_BASED_OUTPATIENT_CLINIC_OR_DEPARTMENT_OTHER)
Admission: EM | Admit: 2017-01-31 | Discharge: 2017-01-31 | Disposition: A | Discharge status: Home / Self Care | Payer: No Typology Code available for payment source | Attending: Emergency Medicine | Admitting: Emergency Medicine

## 2017-01-31 DIAGNOSIS — R350 Frequency of micturition: Secondary | ICD-10-CM | POA: Insufficient documentation

## 2017-01-31 DIAGNOSIS — R110 Nausea: Secondary | ICD-10-CM | POA: Insufficient documentation

## 2017-01-31 DIAGNOSIS — M545 Low back pain, unspecified: Secondary | ICD-10-CM

## 2017-01-31 DIAGNOSIS — R300 Dysuria: Secondary | ICD-10-CM | POA: Insufficient documentation

## 2017-01-31 DIAGNOSIS — R1032 Left lower quadrant pain: Secondary | ICD-10-CM | POA: Insufficient documentation

## 2017-01-31 LAB — URINALYSIS, ROUTINE W REFLEX MICROSCOPIC
Bilirubin Urine: NEGATIVE
Glucose, UA: NEGATIVE mg/dL
Hgb urine dipstick: NEGATIVE
Ketones, ur: NEGATIVE mg/dL
Leukocytes, UA: NEGATIVE
Nitrite: NEGATIVE
Protein, ur: NEGATIVE mg/dL
Specific Gravity, Urine: 1.004 — ABNORMAL LOW (ref 1.005–1.030)
pH: 6 (ref 5.0–8.0)

## 2017-01-31 LAB — CBC WITH DIFFERENTIAL/PLATELET
Basophils Absolute: 0 10*3/uL (ref 0.0–0.1)
Basophils Relative: 0 %
Eosinophils Absolute: 0.2 10*3/uL (ref 0.0–0.7)
Eosinophils Relative: 2 %
HCT: 38.5 % (ref 36.0–46.0)
Hemoglobin: 12.9 g/dL (ref 12.0–15.0)
Lymphocytes Relative: 30 %
Lymphs Abs: 2 10*3/uL (ref 0.7–4.0)
MCH: 31.3 pg (ref 26.0–34.0)
MCHC: 33.5 g/dL (ref 30.0–36.0)
MCV: 93.4 fL (ref 78.0–100.0)
Monocytes Absolute: 0.4 10*3/uL (ref 0.1–1.0)
Monocytes Relative: 7 %
Neutro Abs: 4 10*3/uL (ref 1.7–7.7)
Neutrophils Relative %: 61 %
Platelets: 171 10*3/uL (ref 150–400)
RBC: 4.12 MIL/uL (ref 3.87–5.11)
RDW: 12.8 % (ref 11.5–15.5)
WBC: 6.6 10*3/uL (ref 4.0–10.5)

## 2017-01-31 LAB — BASIC METABOLIC PANEL
Anion gap: 7 (ref 5–15)
BUN: 15 mg/dL (ref 6–20)
CO2: 24 mmol/L (ref 22–32)
Calcium: 8.5 mg/dL — ABNORMAL LOW (ref 8.9–10.3)
Chloride: 106 mmol/L (ref 101–111)
Creatinine, Ser: 0.54 mg/dL (ref 0.44–1.00)
GFR calc Af Amer: >60 mL/min (ref >60)
GFR calc non Af Amer: >60 mL/min (ref >60)
Glucose, Bld: 102 mg/dL — ABNORMAL HIGH (ref 65–99)
Potassium: 4.4 mmol/L (ref 3.5–5.1)
Sodium: 137 mmol/L (ref 135–145)

## 2017-01-31 LAB — PREGNANCY, URINE: Preg Test, Ur: NEGATIVE

## 2017-01-31 MED ORDER — IBUPROFEN 800 MG PO TABS: 800.0000 mg | ORAL_TABLET | Freq: Three times a day (TID) | ORAL | 0 refills | Status: DC

## 2017-01-31 MED ORDER — METHOCARBAMOL 500 MG PO TABS: 500.0000 mg | ORAL_TABLET | Freq: Two times a day (BID) | ORAL | 0 refills | Status: DC

## 2017-01-31 NOTE — ED Triage Notes (Signed)
Low back pain radiating to LLQ for 2 weeks.  Burning with urination.

## 2017-01-31 NOTE — ED Provider Notes (Signed)
[facility] Provider Note   CSN: [id number] Arrival date & time: 01/31/17  1716   By signing my name below, I, Soijett Blue, attest that this documentation has been prepared under the direction and in the presence of Buel Ream, PA-C Electronically Signed: Soijett Blue, ED Scribe. 01/31/17. 6:37 PM.  History   Chief Complaint Chief Complaint  Patient presents with  . Back and LLQ pain    HPI [name] is a 41 y.o. female who presents to the Emergency Department complaining of intermittent, bilateral lower back pain onset 2 weeks ago worsening today. Pt notes that her lower back pain radiates to her LLQ and her lower back pain is worsened with deep breathing. Pt states that her LMP was 01/14/2017. Pt reports associated sharp LLQ pain, nausea, dysuria, frequency, increased thirst, intermittent swelling to bilateral hands and feet x 1 year, and tingling to bilateral legs with back pain. Pt hasn't tried any medications for the relief of her symptoms. Pt denies vaginal discharge, pelvic pain, fever, vomiting, diarrhea, and any other symptoms. Denies recent injury, trauma, or fall. Denies hx of kidney stones. Pt notes that she has had a cholecystectomy, but denies any other abdominal surgeries.    The history is provided by the patient and a significant other. A language interpreter was used (Pt significant other).    History reviewed. No pertinent past medical history.  There are no active problems to display for this patient.   Past Surgical History:  Procedure Laterality Date  . CHOLECYSTECTOMY      OB History    Gravida Para Term Preterm AB Living   4 4 4  0 0     SAB TAB Ectopic Multiple Live Births   0 0 0           Home Medications    Prior to Admission medications   Medication Sig Start Date End Date Taking? Authorizing Provider  amoxicillin-clavulanate (AUGMENTIN) 875-125 MG tablet Take 1 tablet by mouth 2 (two) times daily. 01/07/17   Doyle Askew, FNP  brompheniramine-pseudoephedrine-DM 30-2-10 MG/5ML syrup Take 5 mLs by mouth 4 (four) times daily as needed. 01/07/17   Doyle Askew, FNP  dextromethorphan-guaiFENesin [facility] DM) 30-600 MG 12hr tablet Take 1 tablet by mouth 2 (two) times daily. 01/15/17   Vanetta Mulders, MD  ibuprofen (ADVIL,MOTRIN) 800 MG tablet Take 1 tablet (800 mg total) by mouth 3 (three) times daily. 01/31/17   Emi Holes, PA-C  methocarbamol (ROBAXIN) 500 MG tablet Take 1 tablet (500 mg total) by mouth 2 (two) times daily. 01/31/17   Emi Holes, PA-C    Family History Family History  Problem Relation Age of Onset  . Diabetes Father   . Hypertension Father     Social History Social History  Substance Use Topics  . Smoking status: Never Smoker  . Smokeless tobacco: Never Used  . Alcohol use No     Allergies   Hydrocodone and Hydrocodone   Review of Systems Review of Systems  Constitutional: Negative for chills and fever.  HENT: Negative for facial swelling and sore throat.   Respiratory: Negative for shortness of breath.   Cardiovascular: Negative for chest pain.  Gastrointestinal: Positive for abdominal pain (LLQ) and nausea. Negative for diarrhea and vomiting.  Genitourinary: Positive for dysuria and frequency. Negative for pelvic pain and vaginal discharge.  Musculoskeletal: Positive for back pain (bilateral lower) and joint swelling.  Skin: Negative for rash and wound.  Neurological: Negative for  headaches.  Psychiatric/Behavioral: The patient is not nervous/anxious.      Physical Exam Updated Vital Signs BP 116/73 (BP Location: Right Arm)   Pulse 78   Temp 98.8 F (37.1 C) (Oral)   Resp 18   Ht 5\' 4"  (1.626 m)   Wt 82.6 kg   LMP 01/14/2017   SpO2 100%   BMI 31.24 kg/m   Physical Exam  Constitutional: She appears well-developed and well-nourished. No distress.  HENT:  Head: Normocephalic and atraumatic.  Mouth/Throat: Oropharynx is clear and  moist. No oropharyngeal exudate.  Eyes: Conjunctivae and EOM are normal. Pupils are equal, round, and reactive to light. Right eye exhibits no discharge. Left eye exhibits no discharge. No scleral icterus.  Neck: Normal range of motion. Neck supple. No thyromegaly present.  Cardiovascular: Normal rate, regular rhythm, normal heart sounds and intact distal pulses.  Exam reveals no gallop and no friction rub.   No murmur heard. Pulmonary/Chest: Effort normal and breath sounds normal. No stridor. No respiratory distress. She has no wheezes. She has no rales.  Abdominal: Soft. Bowel sounds are normal. She exhibits no distension. There is no tenderness. There is no rebound and no guarding.  Musculoskeletal: She exhibits no edema.  Left flank tenderness. No midline tenderness to cervical, thoracic, or lumbar spine.  No edema noted to hands, feet, or lower extremities  Lymphadenopathy:    She has no cervical adenopathy.  Neurological: She is alert. Coordination normal.  CN 3-12 intact; normal sensation throughout; 5/5 strength in all 4 extremities; equal bilateral grip strength; no ataxia on finger to nose  Skin: Skin is warm and dry. No rash noted. She is not diaphoretic. No pallor.  Psychiatric: She has a normal mood and affect.  Nursing note and vitals reviewed.    ED Treatments / Results  DIAGNOSTIC STUDIES: Oxygen Saturation is 100% on RA, nl by my interpretation.    COORDINATION OF CARE: 6:25 PM Discussed treatment plan with pt at bedside which includes UA, labs, CT renal stone study, and pt agreed to plan.   Labs (all labs ordered are listed, but only abnormal results are displayed) Labs Reviewed  URINALYSIS, ROUTINE W REFLEX MICROSCOPIC - Abnormal; Notable for the following:       Result Value   Specific Gravity, Urine 1.004 (*)    All other components within normal limits  BASIC METABOLIC PANEL - Abnormal; Notable for the following:    Glucose, Bld 102 (*)    Calcium 8.5 (*)      All other components within normal limits  PREGNANCY, URINE  CBC WITH DIFFERENTIAL/PLATELET    Radiology Ct Renal Stone Study  Result Date: 01/31/2017 CLINICAL DATA:  Left flank pain for 2 weeks EXAM: CT ABDOMEN AND PELVIS WITHOUT CONTRAST TECHNIQUE: Multidetector CT imaging of the abdomen and pelvis was performed following the standard protocol without IV contrast. COMPARISON:  None. FINDINGS: Lower chest: No acute findings. Hepatobiliary: No focal liver abnormality is seen. Status post cholecystectomy. No biliary dilatation. Pancreas: Unremarkable. No pancreatic ductal dilatation or surrounding inflammatory changes. Spleen: Normal in size without focal abnormality. Adrenals/Urinary Tract: Adrenal glands are unremarkable. Kidneys are normal, without renal calculi, focal lesion, or hydronephrosis. Bladder is unremarkable. Stomach/Bowel: Stomach is within normal limits. Appendix is normal. No evidence of bowel wall thickening, distention, or inflammatory changes. Vascular/Lymphatic: No significant vascular findings are present. No enlarged abdominal or pelvic lymph nodes. Reproductive: Uterus and bilateral adnexa are unremarkable. Other: No focal inflammation.  No ascites. Musculoskeletal: No significant skeletal  lesion. IMPRESSION: No significant abnormality. Electronically Signed   By: Ellery Plunkaniel R Mitchell M.D.   On: 01/31/2017 19:09    Procedures Procedures (including critical care time)  Medications Ordered in ED Medications - No data to display   Initial Impression / Assessment and Plan / ED Course  I have reviewed the triage vital signs and the nursing notes.  Pertinent labs & imaging results that were available during my care of the patient were reviewed by me and considered in my medical decision making (see chart for details).     Patient with most likely musculoskeletal back pain. Normal neuro exam, no focal deficits. No saddle anesthesia or loss of bowel or bladder control. CBC  unremarkable. BMP shows glucose 102, calcium 8.5. UA negative. Pregnancy negative.` CT renal stone study negative. No abdominal tenderness or pelvic symptoms indicating pelvic exam at this time. Patient discharged home with ibuprofen and Robaxin. Supportive treatment discussed including ice, heat, and stretches. Patient to follow-up with Dr. Pearletha ForgeHudnall and establish care with a primary care provider. Return precautions discussed. Patient understands and agrees with plan. I discussed patient case with Dr. Fredderick PhenixBelfi who guided the patient's management and agrees with plan.  Final Clinical Impressions(s) / ED Diagnoses   Final diagnoses:  Acute left-sided low back pain without sciatica    New Prescriptions New Prescriptions   IBUPROFEN (ADVIL,MOTRIN) 800 MG TABLET    Take 1 tablet (800 mg total) by mouth 3 (three) times daily.   METHOCARBAMOL (ROBAXIN) 500 MG TABLET    Take 1 tablet (500 mg total) by mouth 2 (two) times daily.   I personally performed the services described in this documentation, which was scribed in my presence. The recorded information has been reviewed and is accurate.     Emi Holeslexandra M Jolene Guyett, PA-C 01/31/17 1931    Rolan BuccoMelanie Belfi, MD 01/31/17 [phone]

## 2017-01-31 NOTE — Discharge Instructions (Signed)
Medications: Ibuprofen, Robaxin  Treatment: Take ibuprofen every 8 hours for your pain. You can alternate with Tylenol as prescribed over-the-counter. Take Robaxin twice daily as needed for muscle pain and spasms. Do not drive or operate machinery when taking this medication. Use ice and heat on your back alternating 20 minutes on, 20 minutes off. Attempt the back exercises attached 1-2 times daily as tolerated.  Follow-up: Please follow-up with sports medicine doctor, Dr. Pearletha ForgeHudnall, for further evaluation and treatment of your back pain. Please follow-up and establish care with a primary care provider by calling the number circled on your discharge paperwork. Please return to the emergency department if you develop any new or worsening symptoms.

## 2018-01-20 IMAGING — CT CT RENAL STONE PROTOCOL
2 of 4 series · 17 of 46 positions shown, 19 images · non-contrast
Comparison: None.

CLINICAL DATA: Left flank pain for 2 weeks

EXAM:
CT ABDOMEN AND PELVIS WITHOUT CONTRAST
TECHNIQUE: Multidetector CT imaging of the abdomen and pelvis was performed
following the standard protocol without IV contrast.

[Series 2: axial st · axial · 0.98mm/px · z∈[-637,-197]mm · 14 of 97 slices shown, 16 images]
[im 5/97  soft-tissue]
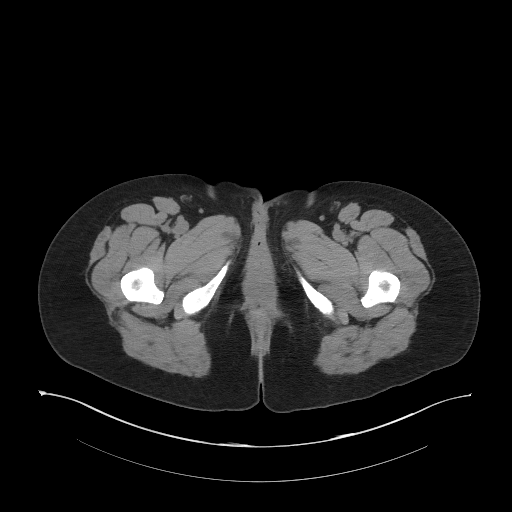
[im 5/97  bone]
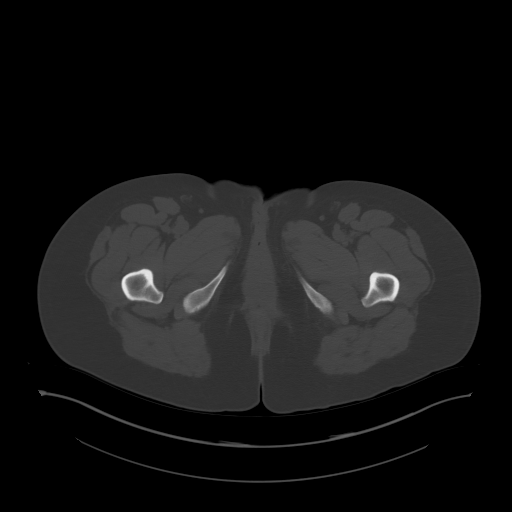
[im 13/97  soft-tissue]
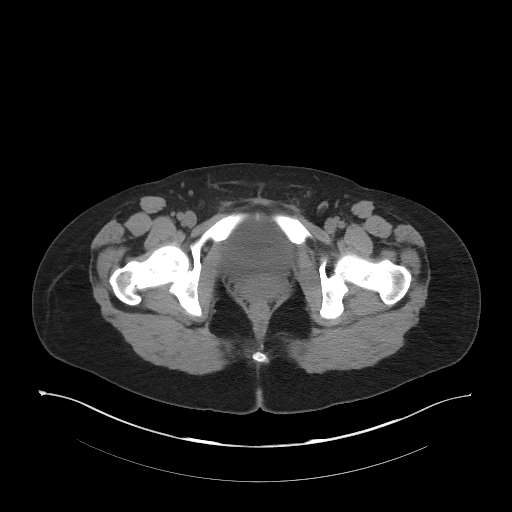
[im 21/97  soft-tissue]
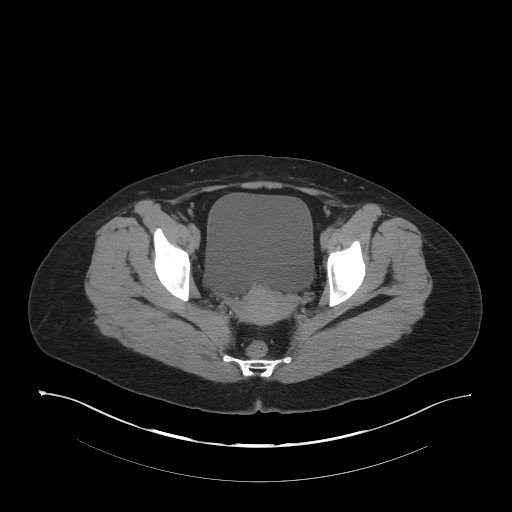
[im 25/97  soft-tissue]
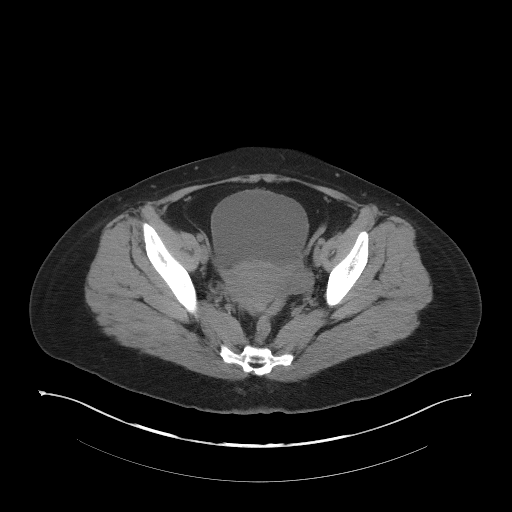
[im 33/97  soft-tissue]
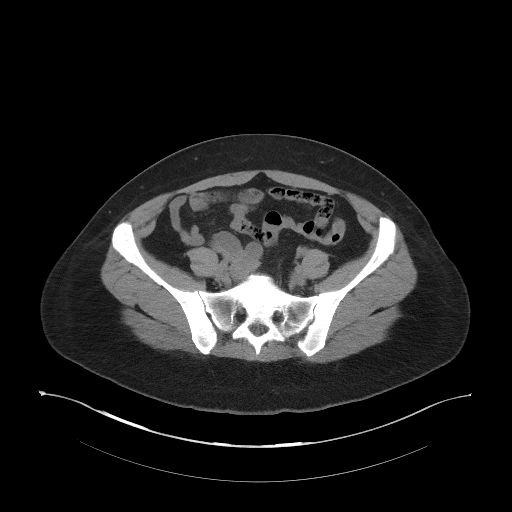
[im 41/97  soft-tissue]
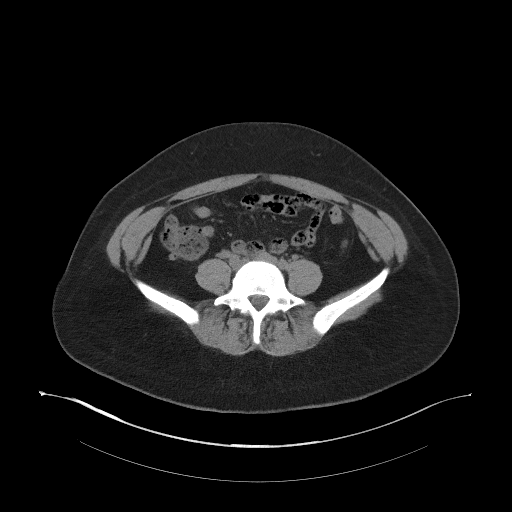
[im 45/97  soft-tissue]
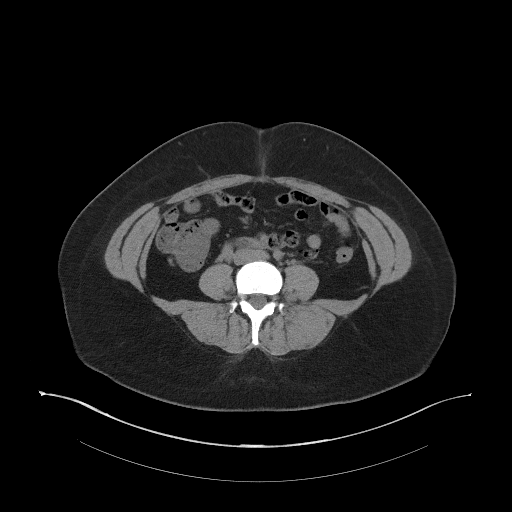
[im 53/97  soft-tissue]
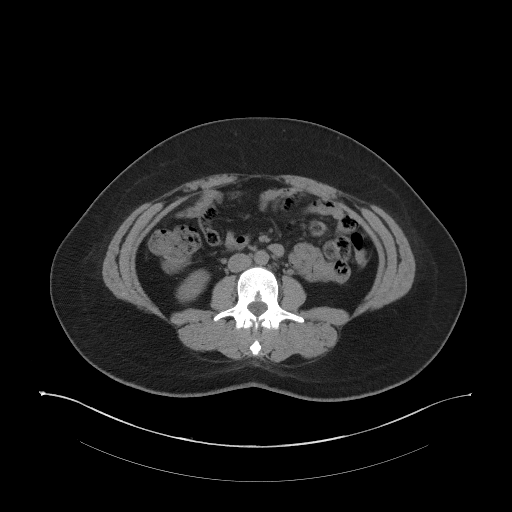
[im 57/97  soft-tissue]
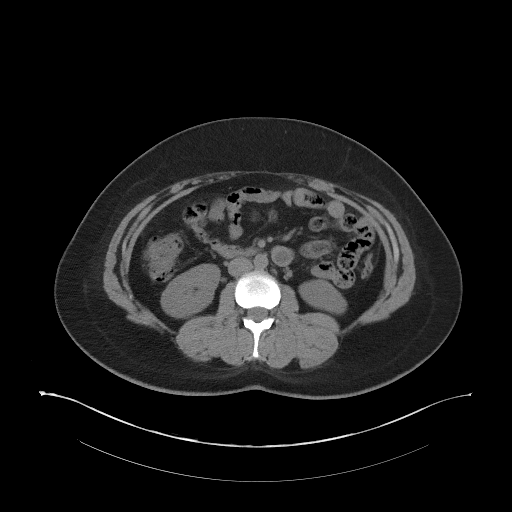
[im 57/97  bone]
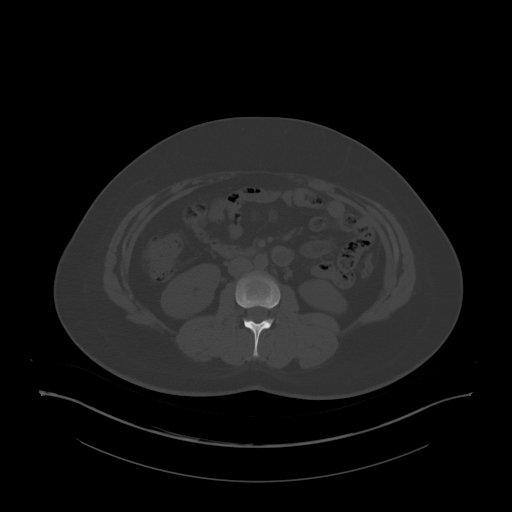
[im 65/97  soft-tissue]
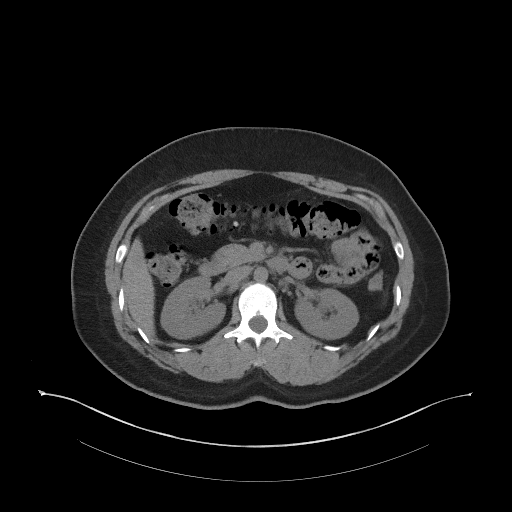
[im 73/97  soft-tissue]
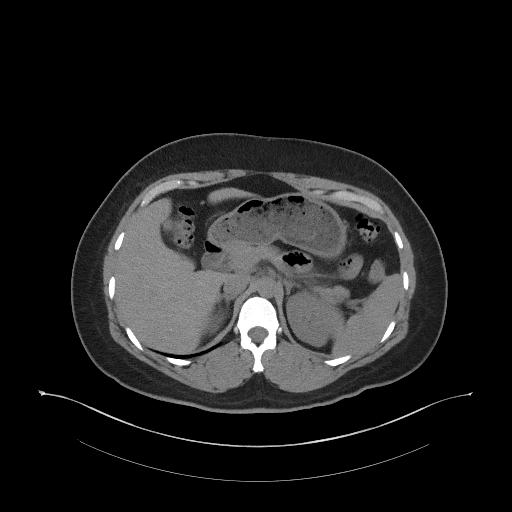
[im 77/97  soft-tissue]
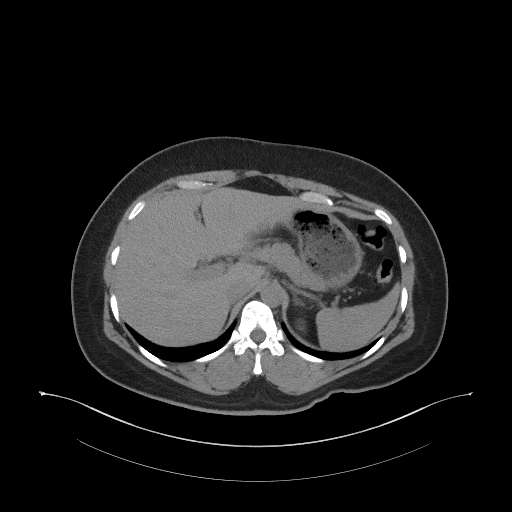
[im 85/97  soft-tissue]
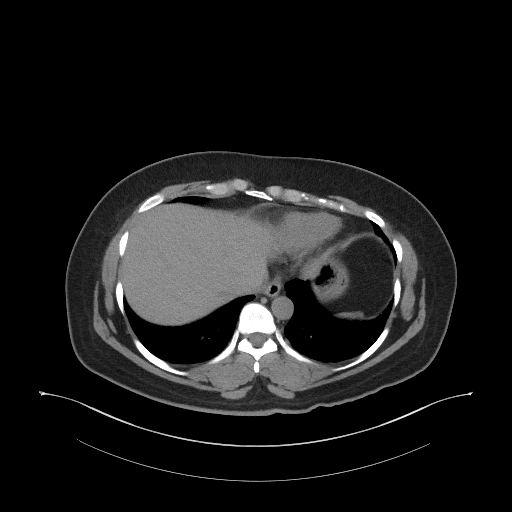
[im 93/97  soft-tissue]
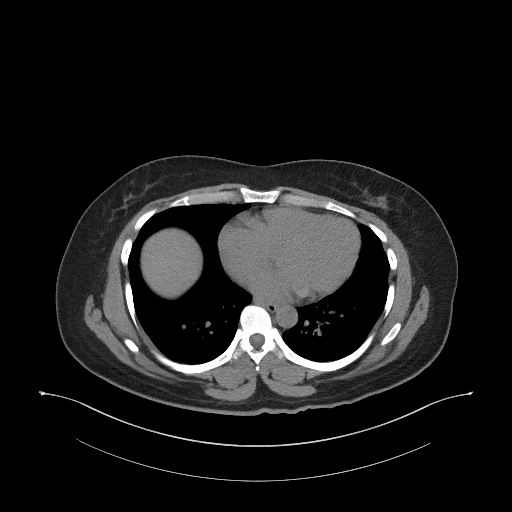

[Series 4: coronal st · coronal · 0.90mm/px · 3 of 88 slices shown]
[im 30/88  soft-tissue]
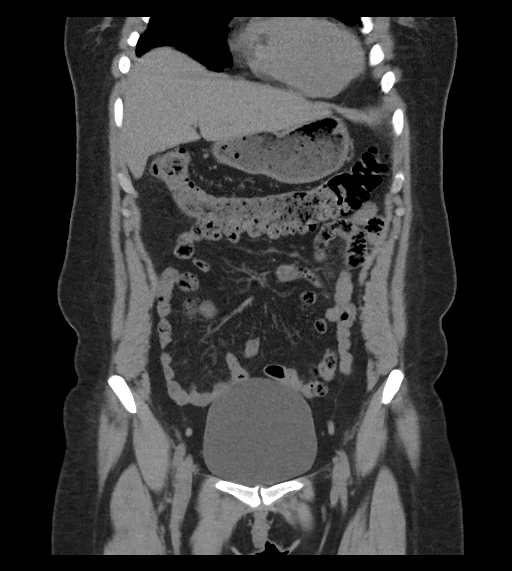
[im 39/88  soft-tissue]
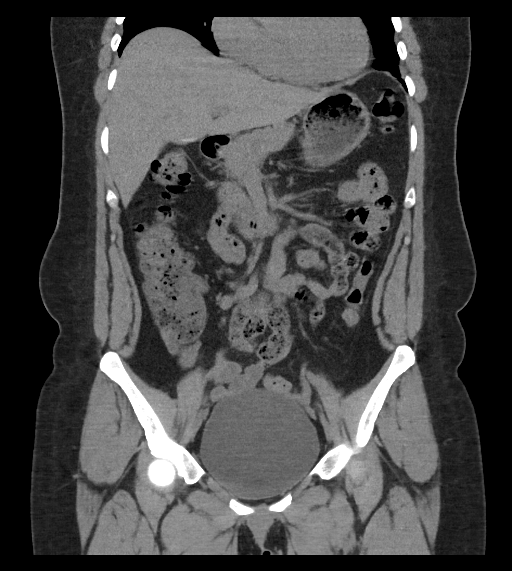
[im 49/88  soft-tissue]
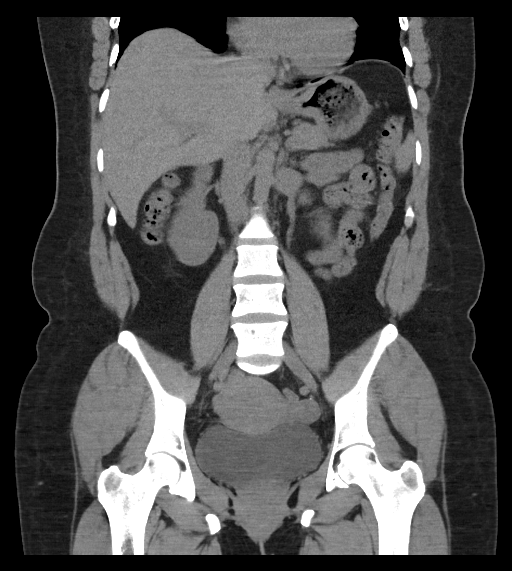

[17 of 46 positions shown; findings below may reference images not displayed]

FINDINGS: Lower chest: No acute findings.

Hepatobiliary: No focal liver abnormality is seen. Status post
cholecystectomy. No biliary dilatation.

Pancreas: Unremarkable. No pancreatic ductal dilatation or
surrounding inflammatory changes.

Spleen: Normal in size without focal abnormality.

Adrenals/Urinary Tract: Adrenal glands are unremarkable. Kidneys are
normal, without renal calculi, focal lesion, or hydronephrosis.
Bladder is unremarkable.

Stomach/Bowel: Stomach is within normal limits. Appendix is normal.
No evidence of bowel wall thickening, distention, or inflammatory
changes.

Vascular/Lymphatic: No significant vascular findings are present. No
enlarged abdominal or pelvic lymph nodes.

Reproductive: Uterus and bilateral adnexa are unremarkable.

Other: No focal inflammation.  No ascites.

Musculoskeletal: No significant skeletal lesion.
IMPRESSION: No significant abnormality.

## 2018-11-02 ENCOUNTER — Other Ambulatory Visit (HOSPITAL_COMMUNITY): Payer: Self-pay | Admitting: *Deleted

## 2018-11-02 DIAGNOSIS — Z1231 Encounter for screening mammogram for malignant neoplasm of breast: Secondary | ICD-10-CM

## 2019-02-11 ENCOUNTER — Ambulatory Visit (HOSPITAL_COMMUNITY)
Admission: RE | Admit: 2019-02-11 | Discharge: 2019-02-11 | Disposition: A | Discharge status: Home / Self Care | Payer: No Typology Code available for payment source | Source: Ambulatory Visit | Attending: Obstetrics and Gynecology | Admitting: Obstetrics and Gynecology

## 2019-02-11 ENCOUNTER — Ambulatory Visit
Admission: RE | Admit: 2019-02-11 | Discharge: 2019-02-11 | Disposition: A | Discharge status: Home / Self Care | Payer: No Typology Code available for payment source | Source: Ambulatory Visit | Attending: Obstetrics and Gynecology | Admitting: Obstetrics and Gynecology

## 2019-02-11 ENCOUNTER — Encounter (HOSPITAL_COMMUNITY): Payer: Self-pay

## 2019-02-11 VITALS — BP 128/72 | Ht 61.0 in | Wt 186.0 lb

## 2019-02-11 DIAGNOSIS — Z1231 Encounter for screening mammogram for malignant neoplasm of breast: Secondary | ICD-10-CM

## 2019-02-11 DIAGNOSIS — Z01419 Encounter for gynecological examination (general) (routine) without abnormal findings: Secondary | ICD-10-CM

## 2019-02-11 NOTE — Addendum Note (Signed)
Encounter addended by: Priscille Heidelberg, RN on: 02/11/2019 11:41 AM  Actions taken: Clinical Note Signed

## 2019-02-11 NOTE — Progress Notes (Addendum)
No complaints today.   Pap Smear: Pap smear completed today. Last Pap smear was in 2017 and normal per patient. Per patient has a history of an abnormal Pap smear in 2016 that a repeat Pap smear was completed for follow-up. Patient stated she has had only one normal Pap smear in 2017 since abnormal Pap smear. No Pap smear results are in Epic.  Physical exam: Breasts Breasts symmetrical. No skin abnormalities bilateral breasts. No nipple retraction bilateral breasts. No nipple discharge bilateral breasts. No lymphadenopathy. No lumps palpated bilateral breasts. No complaints of pain or tenderness on exam. Referred patient to the [facility] for a screening mammogram. Appointment scheduled for Thursday, February 11, 2019 at 1040.        Pelvic/Bimanual   Ext Genitalia No lesions, no swelling and no discharge observed on external genitalia.         Vagina Vagina pink and normal texture. No lesions or discharge observed in vagina.          Cervix Cervix is present. Cervix pink and of normal texture. Cervix reddened around cervical  os. No discharge observed.     Uterus Uterus is present and palpable. Uterus in normal position and normal size.        Adnexae Bilateral ovaries present and palpable. No tenderness on palpation today. Patient complained of bilateral ovary tenderness that is greater within the left breast that comes and goes. Will refer patient to the [facility] for follow-up. Explained to patient that [facility] will not cover the follow-up for her ovary pain. Patient given the [facility] financial assistance application.      Rectovaginal No rectal exam completed today since patient had no rectal complaints. No skin abnormalities observed on exam.    Smoking History: Patient has never smoked.  Patient Navigation: Patient education provided. Access to services provided for patient through [facility] program. Spanish interpreter  provided.   Breast and Cervical Cancer Risk Assessment: Patient has no family history of breast cancer, known genetic mutations, or radiation treatment to the chest before age 54. Patient has no history of cervical dysplasia, immunocompromised, or DES exposure in-utero.  Risk Assessment    Risk Scores      02/11/2019   Last edited by: Priscille Heidelberg, RN   5-year risk: 0.4 %   Lifetime risk: 7 %         Used Spanish interpreter Natale Lay from [place].

## 2019-02-11 NOTE — Addendum Note (Signed)
Encounter addended by: Priscille Heidelberg, RN on: 02/11/2019 11:47 AM  Actions taken: Clinical Note Signed

## 2019-02-11 NOTE — Patient Instructions (Addendum)
Explained breast self awareness with Donita Brooks. Let patient know that if today's Pap smear is normal that her next Pap smear will be due in one year due to she has only had one normal Pap smear since the last abnormal Pap smear. Referred patient to the [facility] for a screening mammogram. Appointment scheduled for Thursday, February 11, 2019 at 1040. Patient aware of appointment and will be there. Let patient know will follow-up with her within the next couple of weeks with results of Pap smear by letter or phone. Informed patient that the Breast Center will follow up with her within the next couple weeks with results of mammogram by letter or phone. Donita Brooks verbalized understanding.  Perina Salvaggio, Kathaleen Maser, RN 10:57 AM

## 2019-02-12 ENCOUNTER — Encounter (HOSPITAL_COMMUNITY): Payer: Self-pay | Admitting: *Deleted

## 2019-02-16 LAB — CYTOLOGY - PAP
Diagnosis: UNDETERMINED — AB
HPV: NOT DETECTED

## 2019-03-10 ENCOUNTER — Encounter: Payer: No Typology Code available for payment source | Admitting: Obstetrics and Gynecology

## 2019-03-17 ENCOUNTER — Telehealth (HOSPITAL_COMMUNITY): Payer: Self-pay | Admitting: *Deleted

## 2019-03-17 NOTE — Telephone Encounter (Signed)
Called patient with Spanish interpreter Julia Sowell to discuss Pap smear result. Let patient know that her Pap smear was abnormal but the HPV typing was negative. Explained to patient that her next Pap smear is due in one year. Explained to patient that she can schedule an appointment with [facility] to have completed. Patient verbalized understanding. 

## 2020-01-28 ENCOUNTER — Other Ambulatory Visit (HOSPITAL_COMMUNITY): Payer: Self-pay

## 2020-01-28 DIAGNOSIS — Z1231 Encounter for screening mammogram for malignant neoplasm of breast: Secondary | ICD-10-CM

## 2020-02-17 ENCOUNTER — Ambulatory Visit: Payer: Self-pay

## 2020-03-09 ENCOUNTER — Ambulatory Visit: Payer: Self-pay

## 2020-04-06 ENCOUNTER — Encounter: Payer: Self-pay | Admitting: Student

## 2020-04-06 ENCOUNTER — Ambulatory Visit: Payer: Self-pay | Admitting: Student

## 2020-04-06 ENCOUNTER — Ambulatory Visit
Admission: RE | Admit: 2020-04-06 | Discharge: 2020-04-06 | Disposition: A | Discharge status: Home / Self Care | Payer: Self-pay | Source: Ambulatory Visit | Attending: Obstetrics and Gynecology | Admitting: Obstetrics and Gynecology

## 2020-04-06 ENCOUNTER — Other Ambulatory Visit: Payer: Self-pay

## 2020-04-06 VITALS — BP 116/74 | Temp 98.0°F | Wt 194.0 lb

## 2020-04-06 DIAGNOSIS — Z01419 Encounter for gynecological examination (general) (routine) without abnormal findings: Secondary | ICD-10-CM

## 2020-04-06 DIAGNOSIS — Z1231 Encounter for screening mammogram for malignant neoplasm of breast: Secondary | ICD-10-CM

## 2020-04-06 NOTE — Progress Notes (Signed)
Ms. Lalia Loudon is a 44 y.o. G4P4000 female who presents to [facility] clinic today with no complaints.    Pap Smear: Pap smear completed today. Last Pap smear was 2/220 at [facility] clinic and was abnormal - ASCUS, negative HPV. Per patient has history of an abnormal Pap smear. Last Pap smear result is available in Epic.   Physical exam: Breasts Breasts symmetrical. No skin abnormalities bilateral breasts. No nipple retraction bilateral breasts. No nipple discharge bilateral breasts. No lymphadenopathy. No lumps palpated bilateral breasts.       Pelvic/Bimanual Ext Genitalia No lesions, no swelling and no discharge observed on external genitalia.        Vagina Vagina pink and normal texture. No lesions or discharge observed in vagina.        Cervix Cervix is present. Cervix pink and of normal texture. No discharge observed.    Uterus Uterus is present and palpable. Uterus in normal position and normal size.        Adnexae Bilateral ovaries present and palpable. No tenderness on palpation.         Rectovaginal No rectal exam completed today since patient had no rectal complaints. No skin abnormalities observed on exam.     Smoking History: Patient has never smoked.    Patient Navigation: Patient education provided. Access to services provided for patient through [facility] program. Spanish interpreter provided.   Colorectal Cancer Screening: Per patient has never had colonoscopy completed No complaints today.    Breast and Cervical Cancer Risk Assessment: Patient does not have family history of breast cancer, known genetic mutations, or radiation treatment to the chest before age 59. Patient does not have history of cervical dysplasia, immunocompromised, or DES exposure in-utero.  Risk Assessment    No risk assessment data for the current encounter   Risk Scores      02/11/2019   Last edited by: Priscille Heidelberg, RN   5-year risk: 0.4 %   Lifetime risk: 7 %           A: [facility] exam with pap smear   P: Referred patient to the [facility] for a screening mammogram. Appointment scheduled today.  Judeth Horn, NP 04/06/2020 10:08 AM

## 2020-04-06 NOTE — Addendum Note (Signed)
Addended by: Judeth Horn B on: 04/06/2020 10:34 AM   Modules accepted: Orders

## 2020-04-10 ENCOUNTER — Telehealth: Payer: Self-pay

## 2020-04-10 NOTE — Telephone Encounter (Signed)
Via Delorise Royals, Spanish Interpreter, patient informed Pap-ASC-US, HPV negative, needs colposcopy. Colposcopy scheduled 05/24/20 @ 1:55pm @ [facility] for [facility]. Patient verbalized understanding.

## 2020-04-10 NOTE — Progress Notes (Signed)
Hi,  Could you please give recommendations for this patient. Her 02/11/2019 pap smear was also ASC-US with negative HPV. I see that she is scheduled for 05/24/2020 gynecologic exam at [facility]. Does she need a colposcopy?  Thank you,  Anyely Cunning, LPN [phone]

## 2020-04-12 NOTE — Progress Notes (Signed)
Good Morning,    I am not which recommendation to follow. Per Judeth Horn, NP, patient needs repeat pap within 1 year. According to the ASCCP guidelines, patient needs repeat pap within 1 year. However, I will follow any recommendations preferred by you.  Thank you,  Japheth Diekman

## 2020-04-13 NOTE — Progress Notes (Signed)
Thank you :)

## 2020-04-17 ENCOUNTER — Telehealth: Payer: Self-pay

## 2020-04-17 NOTE — Telephone Encounter (Signed)
Via Delorise Royals, Spanish Interpreter, patient was informed at this time no colposcopy is needed at this time, repeat pap smear in 1 year. Per Dr. Jolayne Panther, with LGSIL and  Negative HPV, repeat pap within 1 year.

## 2020-05-02 LAB — CYTOLOGY - PAP
Comment: NEGATIVE
Diagnosis: UNDETERMINED — AB
High risk HPV: NEGATIVE

## 2020-05-04 ENCOUNTER — Emergency Department (HOSPITAL_BASED_OUTPATIENT_CLINIC_OR_DEPARTMENT_OTHER): Payer: No Typology Code available for payment source

## 2020-05-04 ENCOUNTER — Encounter (HOSPITAL_BASED_OUTPATIENT_CLINIC_OR_DEPARTMENT_OTHER): Payer: Self-pay | Admitting: Emergency Medicine

## 2020-05-04 ENCOUNTER — Emergency Department (HOSPITAL_BASED_OUTPATIENT_CLINIC_OR_DEPARTMENT_OTHER)
Admission: EM | Admit: 2020-05-04 | Discharge: 2020-05-04 | Disposition: A | Discharge status: Home / Self Care | Payer: No Typology Code available for payment source | Attending: Emergency Medicine | Admitting: Emergency Medicine

## 2020-05-04 ENCOUNTER — Other Ambulatory Visit: Payer: Self-pay

## 2020-05-04 DIAGNOSIS — Z79899 Other long term (current) drug therapy: Secondary | ICD-10-CM | POA: Insufficient documentation

## 2020-05-04 DIAGNOSIS — J189 Pneumonia, unspecified organism: Secondary | ICD-10-CM

## 2020-05-04 DIAGNOSIS — Z8616 Personal history of COVID-19: Secondary | ICD-10-CM | POA: Insufficient documentation

## 2020-05-04 LAB — CBC
HCT: 39.5 % (ref 36.0–46.0)
Hemoglobin: 13.5 g/dL (ref 12.0–15.0)
MCH: 31 pg (ref 26.0–34.0)
MCHC: 34.2 g/dL (ref 30.0–36.0)
MCV: 90.8 fL (ref 80.0–100.0)
Platelets: 318 10*3/uL (ref 150–400)
RBC: 4.35 MIL/uL (ref 3.87–5.11)
RDW: 12.8 % (ref 11.5–15.5)
WBC: 4.9 10*3/uL (ref 4.0–10.5)
nRBC: 0 % (ref 0.0–0.2)

## 2020-05-04 LAB — BASIC METABOLIC PANEL
Anion gap: 9 (ref 5–15)
BUN: 12 mg/dL (ref 6–20)
CO2: 22 mmol/L (ref 22–32)
Calcium: 8.8 mg/dL — ABNORMAL LOW (ref 8.9–10.3)
Chloride: 107 mmol/L (ref 98–111)
Creatinine, Ser: 0.54 mg/dL (ref 0.44–1.00)
GFR calc Af Amer: >60 mL/min (ref >60)
GFR calc non Af Amer: >60 mL/min (ref >60)
Glucose, Bld: 92 mg/dL (ref 70–99)
Potassium: 4.2 mmol/L (ref 3.5–5.1)
Sodium: 138 mmol/L (ref 135–145)

## 2020-05-04 LAB — D-DIMER, QUANTITATIVE: D-Dimer, Quant: 1 ug/mL-FEU — ABNORMAL HIGH (ref 0.00–0.50)

## 2020-05-04 LAB — TROPONIN I (HIGH SENSITIVITY): Troponin I (High Sensitivity): 2 ng/L (ref <18)

## 2020-05-04 LAB — PREGNANCY, URINE: Preg Test, Ur: NEGATIVE

## 2020-05-04 MED ORDER — DOXYCYCLINE HYCLATE 100 MG PO CAPS
100.0000 mg | ORAL_CAPSULE | Freq: Two times a day (BID) | ORAL | 0 refills | Status: DC
Start: 2020-05-04 — End: 2020-12-08

## 2020-05-04 MED ORDER — SODIUM CHLORIDE 0.9 % IV SOLN
1.0000 g | Freq: Once | INTRAVENOUS | Status: AC
  Administered 2020-05-04: 1 g via INTRAVENOUS
  Filled 2020-05-04: qty 10

## 2020-05-04 MED ORDER — IOHEXOL 350 MG/ML SOLN: 100.0000 mL | Freq: Once | INTRAVENOUS | Status: DC | PRN

## 2020-05-04 MED ORDER — IOHEXOL 350 MG/ML SOLN
100.0000 mL | Freq: Once | INTRAVENOUS | Status: AC | PRN
  Administered 2020-05-04: 13:00:00 100 mL via INTRAVENOUS

## 2020-05-04 NOTE — ED Notes (Signed)
Discussed discharge instructions with pt, and family, verbalized good understanding.  Ready for discharge once IV antibiotics complete.

## 2020-05-04 NOTE — ED Notes (Signed)
Pt to CT

## 2020-05-04 NOTE — Discharge Instructions (Signed)
Comunquese con un mdico si: Tiene fiebre. No puede dormir porque no es capaz de Scientist, physiological tos con medicamentos. Solicite ayuda inmediatamente si: Experimenta un empeoramiento en la falta de aire. El dolor de pecho es cada vez ms intenso. La enfermedad empeora, especialmente si usted es un adulto mayor o su sistema inmunitario est debilitado. Tose y escupe sangre.

## 2020-05-04 NOTE — ED Provider Notes (Signed)
[facility] EMERGENCY DEPARTMENT Provider Note   CSN: [id number] Arrival date & time: 05/04/20  1026     History Chief Complaint  Patient presents with  . Chest Pain    [name] is a 44 y.o. female with a history of hyperlipidemia who presents emergency department chief complaint of chest pain.  There is a language barrier and her translation is provided by her husband at bedside.  The patient complains of left-sided chest pain that occasionally radiates to the right side of her chest which has been ongoing for about 20 days since she was infected with COVID-19.  She states that the pain seems to come and go.  It is not associated with exertion.  She has cough but denies frank hemoptysis.  She states that the pain became severe today and woke her out of a sleep.  The pain lasted approximately 3 to 4 minutes, radiated into her left arm and left side of her face and neck.  She had associated nausea and diaphoresis which have improved.  She denies pleuritic type chest pain.  She complains of a history of intermittent right-sided leg swelling that seems to come and go but got worse when she got coronavirus.  She denies smoking, diabetes, hypertension, primary relative with history of CAD.  She does not take oral contraceptives.  She denies history of pulmonary embolus or DVT.  HPI     History reviewed. No pertinent past medical history.  There are no problems to display for this patient.   Past Surgical History:  Procedure Laterality Date  . CHOLECYSTECTOMY       OB History    Gravida  4   Para  4   Term  4   Preterm  0   AB  0   Living        SAB  0   TAB  0   Ectopic  0   Multiple      Live Births              Family History  Problem Relation Age of Onset  . Diabetes Father   . Hypertension Father   . Hyperlipidemia Father   . Hyperlipidemia Mother   . Hypertension Mother     Social History   Tobacco Use  . Smoking status:  Never Smoker  . Smokeless tobacco: Never Used  Substance Use Topics  . Alcohol use: Yes    Comment: ocass.  . Drug use: No    Home Medications Prior to Admission medications   Medication Sig Start Date End Date Taking? Authorizing Provider  amoxicillin-clavulanate (AUGMENTIN) 875-125 MG tablet Take 1 tablet by mouth 2 (two) times daily. Patient not taking: Reported on 02/11/2019 01/07/17   Bing Neighbors, FNP  brompheniramine-pseudoephedrine-DM 30-2-10 MG/5ML syrup Take 5 mLs by mouth 4 (four) times daily as needed. Patient not taking: Reported on 02/11/2019 01/07/17   Bing Neighbors, FNP  dextromethorphan-guaiFENesin [facility] DM) 30-600 MG 12hr tablet Take 1 tablet by mouth 2 (two) times daily. Patient not taking: Reported on 02/11/2019 01/15/17   Vanetta Mulders, MD  ibuprofen (ADVIL,MOTRIN) 800 MG tablet Take 1 tablet (800 mg total) by mouth 3 (three) times daily. 01/31/17   Emi Holes, PA-C  methocarbamol (ROBAXIN) 500 MG tablet Take 1 tablet (500 mg total) by mouth 2 (two) times daily. Patient not taking: Reported on 02/11/2019 01/31/17   Emi Holes, PA-C    Allergies    Hydrocodone and Hydrocodone  Review of Systems   Review of Systems Ten systems reviewed and are negative for acute change, except as noted in the HPI.   Physical Exam Updated Vital Signs BP 118/83 (BP Location: Right Arm)   Pulse 80   Resp 20   Ht 5\' 4"  (1.626 m)   Wt 88 kg   LMP 04/19/2020   SpO2 99%   BMI 33.30 kg/m   Physical Exam Vitals and nursing note reviewed.  Constitutional:      General: She is not in acute distress.    Appearance: She is well-developed. She is not diaphoretic.  HENT:     Head: Normocephalic and atraumatic.  Eyes:     General: No scleral icterus.    Conjunctiva/sclera: Conjunctivae normal.  Cardiovascular:     Rate and Rhythm: Normal rate and regular rhythm.     Heart sounds: Normal heart sounds. No murmur. No friction rub. No gallop.   Pulmonary:      Effort: Pulmonary effort is normal. No respiratory distress.     Breath sounds: Normal breath sounds.  Abdominal:     General: Bowel sounds are normal. There is no distension.     Palpations: Abdomen is soft. There is no mass.     Tenderness: There is no abdominal tenderness. There is no guarding.  Musculoskeletal:     Cervical back: Normal range of motion.     Right lower leg: Tenderness present.     Comments: No observable edema or size difference of the lower extremities  Skin:    General: Skin is warm and dry.  Neurological:     Mental Status: She is alert and oriented to person, place, and time.  Psychiatric:        Mood and Affect: Mood is anxious.        Behavior: Behavior normal.     ED Results / Procedures / Treatments   Labs (all labs ordered are listed, but only abnormal results are displayed) Labs Reviewed - No data to display  EKG None  Radiology No results found.  Procedures Procedures (including critical care time)  Medications Ordered in ED Medications - No data to display  ED Course  I have reviewed the triage vital signs and the nursing notes.  Pertinent labs & imaging results that were available during my care of the patient were reviewed by me and considered in my medical decision making (see chart for details).    MDM Rules/Calculators/A&P                      CC: Chest pain VS:  Vitals:   05/04/20 1345 05/04/20 1414 05/04/20 1510 05/04/20 1534  BP:  112/67 105/74   Pulse: 80 83 82   Resp: 20 18    Temp:   98.6 F (37 C)   TempSrc:      SpO2: 99% 98% 98% 98%  Weight:      Height:        SW:FUXNATF is gathered by patient and husband at bedside. Previous records obtained and reviewed. DDX:The patient's complaint of chest pain involves an extensive number of diagnostic and treatment options, and is a complaint that carries with it a high risk of complications, morbidity, and potential mortality. Given the large differential diagnosis,  medical decision making is of high complexity. The emergent differential diagnosis of chest pain includes: Acute coronary syndrome, pericarditis, aortic dissection, pulmonary embolism, tension pneumothorax, pneumonia, and esophageal rupture.   Labs: I ordered reviewed  and interpreted labs which include pregnancy which is negative.  Troponin which is negative.  CBC shows no abnormalities, BMP without Kuneff again abnormality.  D-dimer is elevated at 1.00. Imaging: I ordered and reviewed images which included portable 1 view chest and CT angio PE with contrast.. I independently visualized and interpreted all imaging. Significant findings include plain film shows a right lower lobe pneumonia, CT angio shows no evidence of pulmonary embolus but does show multifocal pneumonia.  EKG: EKG shows normal sinus rhythm at a rate of 72 Consults: N/A MDM: Patient here 3 weeks after Covid infection.  She has had persistent chest pain that suddenly worsened.  She has headaches and body aches as well.  Suspect secondary bacterial infection status post Covid virus infection versus lingering post Covid symptoms.  Patient will be treated for community-acquired pneumonia.  Given IV Rocephin here and will be discharged on doxycycline.  I discussed the case with Dr. Stevie Kern who agrees with work-up and plan for discharge.  Patient is afebrile, hemodynamically stable without hypoxia.  No evidence of pulmonary embolus on exam.  She appears safe for discharge at this time Patient disposition: Discharge The patient appears reasonably screened and/or stabilized for discharge and I doubt any other medical condition or other [facility] requiring further screening, evaluation, or treatment in the ED at this time prior to discharge. I have discussed lab and/or imaging findings with the patient and answered all questions/concerns to the best of my ability.I have discussed return precautions and OP follow up.    Final Clinical Impression(s) /  ED Diagnoses Final diagnoses:  None    Rx / DC Orders ED Discharge Orders    None       Arthor Captain, PA-C 05/04/20 1820    Milagros Loll, MD 05/05/20 [phone]

## 2020-05-04 NOTE — ED Triage Notes (Signed)
Info obtained from husband, pt speaks spanish. Ongoing L side chest pain that began 3 weeks ago when she was diagnosed with COVID. Also reports SOB.

## 2020-05-24 ENCOUNTER — Encounter: Payer: No Typology Code available for payment source | Admitting: Obstetrics and Gynecology

## 2020-08-09 ENCOUNTER — Telehealth: Payer: Self-pay

## 2020-08-09 NOTE — Telephone Encounter (Signed)
Rosey Bath from [facility] called stating that their office is referring pt for pelvic pain. Rosey Bath state pt's last pap was done through [facility] program. Rosey Bath states she will fax pt's referral. We will call pt's daughter at [phone] to explain the process to pt.  Horace Wishon l Janeshia Ciliberto, CMA

## 2020-08-09 NOTE — Telephone Encounter (Signed)
Sharin Mons @ [facility] [phone] x [phone]) calls and states patient states she was referred to gynecologist through [facility]. Fonnie Mu, spoke with Rosey Bath today, clarified that patient was not referred to gynecologist, not covered by [facility],  does not need colposcopy, repeat pap within 1 year. However, Patient has complaints of abdominal pain and needs to see a Theatre manager per [place]. Rosey Bath stated that [facility] would refer patient to gynecologist as uninsured. Contact for future [facility] referrals provided as requested.

## 2020-09-21 ENCOUNTER — Encounter: Payer: Self-pay | Admitting: *Deleted

## 2020-09-21 ENCOUNTER — Encounter: Payer: Self-pay | Admitting: Obstetrics & Gynecology

## 2020-09-21 ENCOUNTER — Ambulatory Visit (INDEPENDENT_AMBULATORY_CARE_PROVIDER_SITE_OTHER): Payer: Self-pay | Admitting: Obstetrics & Gynecology

## 2020-09-21 ENCOUNTER — Other Ambulatory Visit: Payer: Self-pay

## 2020-09-21 ENCOUNTER — Other Ambulatory Visit (HOSPITAL_COMMUNITY)
Admission: RE | Admit: 2020-09-21 | Discharge: 2020-09-21 | Disposition: A | Discharge status: Home / Self Care | Payer: No Typology Code available for payment source | Source: Ambulatory Visit | Attending: Obstetrics & Gynecology | Admitting: Obstetrics & Gynecology

## 2020-09-21 VITALS — BP 115/78 | HR 71 | Ht 61.0 in | Wt 187.2 lb

## 2020-09-21 DIAGNOSIS — N898 Other specified noninflammatory disorders of vagina: Secondary | ICD-10-CM

## 2020-09-21 DIAGNOSIS — R102 Pelvic and perineal pain: Secondary | ICD-10-CM

## 2020-09-21 DIAGNOSIS — N939 Abnormal uterine and vaginal bleeding, unspecified: Secondary | ICD-10-CM

## 2020-09-21 DIAGNOSIS — R350 Frequency of micturition: Secondary | ICD-10-CM

## 2020-09-21 DIAGNOSIS — N941 Unspecified dyspareunia: Secondary | ICD-10-CM

## 2020-09-21 LAB — POCT URINALYSIS DIP (DEVICE)
Bilirubin Urine: NEGATIVE
Glucose, UA: NEGATIVE mg/dL
Hgb urine dipstick: NEGATIVE
Ketones, ur: NEGATIVE mg/dL
Leukocytes,Ua: NEGATIVE
Nitrite: NEGATIVE
Protein, ur: NEGATIVE mg/dL
Specific Gravity, Urine: 1.015 (ref 1.005–1.030)
Urobilinogen, UA: 0.2 mg/dL (ref 0.0–1.0)
pH: 7 (ref 5.0–8.0)

## 2020-09-21 NOTE — Progress Notes (Signed)
Patient ID: [name], female   DOB: 1976-06-18, 44 y.o.   MRN: [id number]  Chief Complaint  Patient presents with  . Gynecologic Exam    HPI [name] is a 44 y.o. female.  [id number] Patient's last menstrual period was 09/13/2020. Patient reports about 4-5 months of pelvic pain and irregular bleeding. Mild pain with intercourse, some urinary sx with SUI and frequency. No change in sex partner HPI  Past Medical History:  Diagnosis Date  . Medical history non-contributory     Past Surgical History:  Procedure Laterality Date  . CHOLECYSTECTOMY      Family History  Problem Relation Age of Onset  . Diabetes Father   . Hypertension Father   . Hyperlipidemia Father   . Hyperlipidemia Mother   . Hypertension Mother     Social History Social History   Tobacco Use  . Smoking status: Never Smoker  . Smokeless tobacco: Never Used  Vaping Use  . Vaping Use: Never used  Substance Use Topics  . Alcohol use: Yes    Comment: ocass.  . Drug use: No    Allergies  Allergen Reactions  . Hydrocodone   . Hydrocodone Rash    Current Outpatient Medications  Medication Sig Dispense Refill  . Naproxen Sodium (ALEVE) 220 MG CAPS Take by mouth. As needed    . doxycycline (VIBRAMYCIN) 100 MG capsule Take 1 capsule (100 mg total) by mouth 2 (two) times daily. One po bid x 7 days (Patient not taking: Reported on 09/21/2020) 14 capsule 0   No current facility-administered medications for this visit.    Review of Systems Review of Systems  Constitutional: Negative.   Gastrointestinal: Negative for abdominal distention and constipation.  Genitourinary: Positive for dyspareunia, pelvic pain, vaginal bleeding and vaginal discharge.    Blood pressure 115/78, pulse 71, height 5\' 1"  (1.549 m), weight 187 lb 3.2 oz (84.9 kg), last menstrual period 09/13/2020.  Physical Exam Physical Exam Vitals and nursing note reviewed. Exam conducted with a chaperone present.   Constitutional:      Appearance: Normal appearance.  Cardiovascular:     Rate and Rhythm: Normal rate.  Pulmonary:     Effort: Pulmonary effort is normal.  Abdominal:     General: Abdomen is flat. There is no distension.     Palpations: There is no mass.  Genitourinary:    General: Normal vulva.     Vagina: Vaginal discharge present.     Comments: Pelvic exam: VULVA: normal appearing vulva with no masses, tenderness or lesions, VAGINA: vaginal discharge - clear, CERVIX: normal appearing cervix without discharge or lesions, UTERUS: tenderness mild.  Neurological:     Mental Status: She is alert.     Data Reviewed Pap test results  Assessment Pelvic pain, will f/u on testing   Plan Orders Placed This Encounter  Procedures  . 09/15/2020 PELVIC COMPLETE WITH TRANSVAGINAL    Standing Status:   Future    Standing Expiration Date:   09/21/2021    Order Specific Question:   Reason for Exam (SYMPTOM  OR DIAGNOSIS REQUIRED)    Answer:   pelvic pain    Order Specific Question:   Preferred imaging location?    Answer:   [facility]    Order Specific Question:   Release to patient    Answer:   Immediate  . POCT urinalysis dip (device)   STD probe sent, RTC 4 weeks    09/23/2021 09/21/2020, 10:30 AM

## 2020-09-21 NOTE — Patient Instructions (Signed)
Dolor plvico en la mujer Pelvic Pain, Female El dolor plvico se siente en la parte inferior del vientre (abdomen), debajo del ombligo y a nivel de las caderas. El dolor puede comenzar en forma repentina (ser agudo), reaparecer (ser recurrente) o durar mucho tiempo (volverse crnico). El dolor plvico que dura ms de 6 meses se denomina dolor plvico crnico. El dolor plvico puede tener muchas causas. A veces, la causa del dolor plvico no se conoce. Siga estas indicaciones en su casa:   Tome los medicamentos de venta libre y los recetados solamente como se lo haya indicado el mdico.  Haga reposo como se lo haya indicado el mdico.  No tenga relaciones sexuales si siente dolor.  Lleve un registro del dolor plvico. Escriba los siguientes datos: ? Cundo comenz el dolor. ? La ubicacin del dolor. ? Qu parece mejorar o empeorar el dolor, como alimentos o el perodo (ciclo menstrual). ? Cualquier sntoma que se presente junto con el dolor.  Concurra a todas las visitas de control como se lo haya indicado el mdico. Esto es importante. Comunquese con un mdico si:  Los medicamentos no le alivian el dolor.  El dolor regresa.  Aparecen nuevos sntomas.  Tiene sangrado o secrecin inusual de la vagina.  Tiene fiebre o escalofros.  Tiene dificultad para defecar (estreimiento).  Observa sangre en el pis (orina) o en la materia fecal (heces).  El pis tiene mal olor.  Se siente dbil o siente que va a desvanecerse. Solicite ayuda inmediatamente si:  Siente un dolor repentino y muy intenso.  El dolor es cada vez peor.  Siente un dolor muy intenso y tambin tiene alguno de estos sntomas: ? Fiebre. ? Malestar estomacal (nuseas). ? Vmitos. ? Tiene mucho sudor.  Se desmaya (pierde el conocimiento). Resumen  El dolor plvico se siente en la parte inferior del vientre (abdomen), debajo del ombligo y a nivel de las caderas.  Hay muchas causas posibles del dolor  plvico.  Lleve un registro del dolor plvico. Esta informacin no tiene como fin reemplazar el consejo del mdico. Asegrese de hacerle al mdico cualquier pregunta que tenga. Document Revised: 08/06/2018 Document Reviewed: 08/06/2018 Elsevier Patient Education  2020 Elsevier Inc.  

## 2020-09-22 LAB — CERVICOVAGINAL ANCILLARY ONLY
Bacterial Vaginitis (gardnerella): NEGATIVE
Candida Glabrata: NEGATIVE
Candida Vaginitis: NEGATIVE
Chlamydia: NEGATIVE
Comment: NEGATIVE
Comment: NEGATIVE
Comment: NEGATIVE
Comment: NEGATIVE
Comment: NEGATIVE
Comment: NORMAL
Neisseria Gonorrhea: NEGATIVE
Trichomonas: NEGATIVE

## 2020-09-27 ENCOUNTER — Telehealth: Payer: Self-pay | Admitting: *Deleted

## 2020-09-27 NOTE — Telephone Encounter (Addendum)
VM message left by pt's daughter stating that pt was given Doxycycline @ visit last Thursday (9/23) however [facility] told her it is not available. Per chart review and consult w/Dr. Debroah Loop, pt does not require this medication. Possibly there was a misunderstanding during the visit. I called pt w/interpreter Eda Royal and informed her that she does not require the medication. Her tests last week did not show any type of infection. Pt voiced understanding.

## 2020-09-28 ENCOUNTER — Other Ambulatory Visit: Payer: Self-pay

## 2020-09-28 ENCOUNTER — Ambulatory Visit
Admission: RE | Admit: 2020-09-28 | Discharge: 2020-09-28 | Disposition: A | Discharge status: Home / Self Care | Payer: No Typology Code available for payment source | Source: Ambulatory Visit | Attending: Obstetrics & Gynecology | Admitting: Obstetrics & Gynecology

## 2020-09-28 DIAGNOSIS — N898 Other specified noninflammatory disorders of vagina: Secondary | ICD-10-CM | POA: Insufficient documentation

## 2020-10-06 ENCOUNTER — Telehealth: Payer: Self-pay | Admitting: Lactation Services

## 2020-10-06 DIAGNOSIS — N898 Other specified noninflammatory disorders of vagina: Secondary | ICD-10-CM

## 2020-10-06 NOTE — Telephone Encounter (Signed)
Patient was called with assistance of Raquel, Research officer, trade union.   Patient informed that her [place] was normal and that Dr. Debroah Loop would like for her to follow up with her previously scheduled appointment on 11/04 at 8:15 am to follow up on her symptoms and concerns.   Patient questions answered.

## 2020-10-06 NOTE — Telephone Encounter (Signed)
-----   Message from Adam Phenix, MD sent at 10/06/2020  6:32 AM EDT ----- Patient tests were reviewed and negative, she should have a return visit scheduled

## 2020-11-02 ENCOUNTER — Other Ambulatory Visit: Payer: Self-pay

## 2020-11-02 ENCOUNTER — Encounter: Payer: Self-pay | Admitting: Obstetrics & Gynecology

## 2020-11-02 ENCOUNTER — Ambulatory Visit (INDEPENDENT_AMBULATORY_CARE_PROVIDER_SITE_OTHER): Payer: Self-pay | Admitting: Obstetrics & Gynecology

## 2020-11-02 VITALS — BP 101/73 | HR 73 | Ht 60.25 in | Wt 189.0 lb

## 2020-11-02 DIAGNOSIS — R102 Pelvic and perineal pain: Secondary | ICD-10-CM

## 2020-11-02 DIAGNOSIS — N8000 Endometriosis of the uterus, unspecified: Secondary | ICD-10-CM

## 2020-11-02 NOTE — Patient Instructions (Signed)
Levonorgestrel intrauterine device (IUD) Qu es este medicamento? El DIU CON LEVONORGESTREL es un dispositivo anticonceptivo (control de la natalidad). Un profesional de Regulatory affairs officer dispositivo dentro el tero. [place] para evitar los embarazos. Este dispositivo tambin puede usarse para tratar el sangrado intenso que se produce durante el perodo menstrual. Este medicamento puede ser utilizado para otros usos; si tiene alguna pregunta consulte con su proveedor de atencin mdica o con su farmacutico. MARCAS COMUNES: Verdia [place], [place], [place], Skyla Qu le debo informar a mi profesional de la salud antes de tomar este medicamento? Necesitan saber si usted presenta alguno de los [employer] o situaciones: examen de Papanicolaou anormal cncer de mama, tero o crvix diabetes endometritis infeccin genital o plvica actual o en el pasado tener ms de una pareja sexual o si su pareja tiene ms de una pareja enfermedad cardiaca antecedentes de Media planner ectpico o tubrico problemas del sistema inmunolgico DIU colocado en su lugar enfermedad o tumor heptico problemas con cogulos sanguneos o tomar anticoagulantes convulsiones uso de drogas intravenosas tero con forma inusual sangrado vaginal que no ha sido explicado una reaccin alrgica o inusual al levonorgestrel, a otras hormonas, a la silicona, o al polietileno, a otros medicamentos, alimentos, colorantes o conservantes si est embarazada o buscando quedar embarazada si est amamantando a un beb Cmo debo [employer]? Un profesional de Estate agent este dispositivo en el tero. Hable con su pediatra para informarse acerca del uso de este medicamento en nios. Puede requerir atencin especial. Sobredosis: Pngase en contacto inmediatamente con un centro toxicolgico o una sala de urgencia si usted cree que haya tomado demasiado medicamento. ATENCIN: [employer] Foods es solo para usted. No comparta este medicamento  con nadie. Qu sucede si me olvido de una dosis? No se aplica en este caso. Segn la marca del dispositivo que tenga insertado, el dispositivo deber reemplazarse cada 3 a 6 aos si desea continuar usando este tipo de mtodo anticonceptivo. Qu puede interactuar con este medicamento? No tome este medicamento con ninguno de los siguientes frmacos: amprenavir bosentano fosamprenavir Este medicamento tambin puede interactuar con los siguientes medicamentos: aprepitant armodafinilo barbitricos para inducir el sueo o para el tratamiento de convulsiones bexaroteno boceprevir griseofulvina medicamentos para tratar convulsiones, tales como carbamazepina, etotona, [place], [place], [place], topiramato modafinilo pioglitazona rifabutina rifampicina rifapentina algunos medicamentos para tratar la infeccin por el VIH, tales como atazanavir, efavirenz, indinavir, lopinavir, nelfinavir, tipranavir, ritonavir hierba de [place] warfarina Puede ser que esta lista no menciona todas las posibles interacciones. Informe a su profesional de [employer]	[place] de [employer] productos a base de hierbas, medicamentos de [place] o suplementos nutritivos que est tomando. Si usted fuma, consume bebidas alcohlicas o si utiliza drogas ilegales, indqueselo tambin a su profesional de KB Home	[place]. Algunas sustancias pueden interactuar con su medicamento. A qu debo estar atento al usar [employer]? Visite a su mdico o a su profesional de la salud para revisar su evolucin peridicamente. Consulte a su mdico si usted o su pareja tiene contacto sexual con Producer, television/film/video, descubre que tiene VIH positivo, o contrae una enfermedad de transmisin sexual. Cindra Presume producto no la protege de la infeccin por VIH (SIDA) ni de ninguna otra enfermedad de transmisin sexual. Puede verificar la colocacin del DIU usted misma colocando los dedos limpios en la parte superior de la vagina para tocar los hilos. No tire de los hilos. Es  un buen hbito verificar la colocacin del DIU despus de cada perodo menstrual. Llame a su mdico  de inmediato si siente ms del DIU que solo los hilos o si no puede sentir los hilos. El DIU podra salirse solo. Es posible que quede embarazada si el dispositivo se sale. Si nota que se ha [employer] use un mtodo anticonceptivo de respaldo, [place] condones, y llame a su proveedor de Psychologist, prison and probation services. Usar tampones no cambiar la posicin del DIU y puede usarlos sin problema durante sus perodos menstruales. Este DIU puede escanearse en forma segura en una resonancia magntica (MRI) solo bajo condiciones especficas. Antes de realizarse [employer], informe a su proveedor de atencin mdica que tiene colocado un DIU, y el tipo de DIU que tiene. Qu efectos secundarios puedo tener al [employer] este medicamento? Efectos secundarios que debe informar a su mdico o a Producer, television/film/video de la salud tan pronto como sea posible:  Therapist, art como erupcin cutnea, picazn o urticarias, hinchazn de la cara, labios o lengua  fiebre, sntomas gripales  llagas genitales  alta presin sangunea  ausencia de un perodo menstrual durante 6 semanas mientras lo utiliza  dolor, hinchazn o calor en las piernas  dolor o sensibilidad del plvico  dolor de cabeza repentino o severo  signos de Psychiatrist  calambres estomacales  falta de aliento repentina  problemas de coordinacin, del habla, al caminar  sangrado, flujo vaginal inusual  color amarillento de los ojos o la piel Efectos secundarios que, por lo general, no requieren Psychologist, prison and probation services (debe informarlos a su mdico o a Producer, television/film/video de la salud si persisten o si son molestos):  acn  dolor de pecho  cambios en el deseo sexual o capacidad  cambios de peso  calambres, Research scientist (life sciences) o sensacin de Research scientist (life sciences) se introduce el dispositivo  dolor de cabeza  sangrado menstruales irregulares en los primeros 3 a 6 meses de  usar  nuseas Puede ser que esta lista no menciona todos los posibles efectos secundarios. Comunquese a su mdico por asesoramiento mdico [employer]. Usted puede informar los efectos secundarios a la FDA por telfono al 1-800-FDA-1088. Dnde debo guardar mi medicina? No se aplica en este caso. ATENCIN: Este folleto es un resumen. Puede ser que no cubra toda la posible informacin. Si usted tiene preguntas acerca de esta medicina, consulte con su mdico, su farmacutico o su profesional de Radiographer, therapeutic.  2020 Elsevier/Gold Standard (2019-01-07 00:00:00)

## 2020-11-02 NOTE — Progress Notes (Signed)
Patient ID: [name], female   DOB: [birth year], 44 y.o.   MRN: [id number]  Chief Complaint  Patient presents with  . Follow-up  pelvic pain  HPI [name] is a 44 y.o. female.  [id number] Patient's last menstrual period was 10/09/2020. Patient complains of pelvic pain with and without menses. [place] was done and reviewed today. She takes Aleve on occasion that helps. HPI  Past Medical History:  Diagnosis Date  . Medical history non-contributory     Past Surgical History:  Procedure Laterality Date  . CHOLECYSTECTOMY      Family History  Problem Relation Age of Onset  . Diabetes Father   . Hypertension Father   . Hyperlipidemia Father   . Hyperlipidemia Mother   . Hypertension Mother     Social History Social History   Tobacco Use  . Smoking status: Never Smoker  . Smokeless tobacco: Never Used  Vaping Use  . Vaping Use: Never used  Substance Use Topics  . Alcohol use: Yes    Comment: rare  . Drug use: No    Allergies  Allergen Reactions  . Hydrocodone   . Hydrocodone Rash    Current Outpatient Medications  Medication Sig Dispense Refill  . acetaminophen (TYLENOL) 500 MG tablet Pain Reliever (acetaminophen) 500 mg tablet  TAKE 2 TABLETS BY MOUTH EVERY 6 HOURS FOR 10 DAYS    . Cyanocobalamin (VITAMIN B-12 PO) Take 1 tablet by mouth daily.    . Naproxen Sodium (ALEVE) 220 MG CAPS Take by mouth. As needed    . Zinc Sulfate (ZINC 15 PO) Take 1 tablet by mouth daily.    Marland Kitchen doxycycline (VIBRAMYCIN) 100 MG capsule Take 1 capsule (100 mg total) by mouth 2 (two) times daily. One po bid x 7 days (Patient not taking: Reported on 09/21/2020) 14 capsule 0   No current facility-administered medications for this visit.    Review of Systems Review of Systems  Constitutional: Negative.   HENT: Negative.   Respiratory: Negative.   Gastrointestinal: Negative.   Genitourinary: Positive for dyspareunia, menstrual problem and pelvic pain. Negative for  vaginal discharge.    Blood pressure 101/73, pulse 73, height 5' 0.25" (1.53 m), weight 189 lb (85.7 kg), last menstrual period 10/09/2020.  Physical Exam Physical Exam Vitals and nursing note reviewed.  Constitutional:      Appearance: Normal appearance.  Pulmonary:     Effort: Pulmonary effort is normal.  Abdominal:     General: There is no distension.  Neurological:     Mental Status: She is alert.  Psychiatric:        Mood and Affect: Mood normal.        Behavior: Behavior normal.     Data Reviewed Narrative & Impression  CLINICAL DATA:  Pelvic pain  EXAM: TRANSABDOMINAL AND TRANSVAGINAL ULTRASOUND OF PELVIS  TECHNIQUE: Both transabdominal and transvaginal ultrasound examinations of the pelvis were performed. Transabdominal technique was performed for global imaging of the pelvis including uterus, ovaries, adnexal regions, and pelvic cul-de-sac. It was necessary to proceed with endovaginal exam following the transabdominal exam to visualize the uterus, endometrium, ovaries and adnexa.  COMPARISON:  CT 01/31/2017  FINDINGS: Uterus  Measurements: 8.8 x 4.6 x 5.5 cm = volume: 118.5 mL. Heterogeneous echotexture. No focal fibroid.  Endometrium  Thickness: 9 mm in thickness.  No focal abnormality visualized.  Right ovary  Measurements: 3.0 x 1.1 x 1.4 cm = volume: 2.5 mL. Normal appearance/no adnexal mass.  Left ovary  Measurements:  3.6 x 1.9 x 2.5 cm = volume: 8.9 mL. Normal appearance/no adnexal mass.  Other findings  No abnormal free fluid.  IMPRESSION: No acute findings.  Heterogeneous echotexture throughout the uterus can be seen with adenomyosis.   Electronically Signed   By: Charlett Nose M.D.   On: 09/28/2020 09:10      Assessment Pelvic pain, dysmenorrhea with probable adenomyosis Requests BCM, does not want OCP due to migraine  Plan She is familiar with IUD and this was discussed extensively. She will review  literature re: LNIUD and may schedule insertion as this may help sx of adenomyosis.    Scheryl Darter 11/02/2020, 8:59 AM

## 2020-12-08 ENCOUNTER — Encounter: Payer: Self-pay | Admitting: Obstetrics & Gynecology

## 2020-12-08 ENCOUNTER — Other Ambulatory Visit: Payer: Self-pay

## 2020-12-08 ENCOUNTER — Ambulatory Visit (INDEPENDENT_AMBULATORY_CARE_PROVIDER_SITE_OTHER): Payer: Self-pay | Admitting: Obstetrics & Gynecology

## 2020-12-08 VITALS — BP 120/75 | HR 81 | Ht 62.5 in | Wt 187.3 lb

## 2020-12-08 DIAGNOSIS — Z789 Other specified health status: Secondary | ICD-10-CM | POA: Insufficient documentation

## 2020-12-08 DIAGNOSIS — Z30014 Encounter for initial prescription of intrauterine contraceptive device: Secondary | ICD-10-CM

## 2020-12-08 DIAGNOSIS — K219 Gastro-esophageal reflux disease without esophagitis: Secondary | ICD-10-CM | POA: Insufficient documentation

## 2020-12-08 DIAGNOSIS — Z30430 Encounter for insertion of intrauterine contraceptive device: Secondary | ICD-10-CM

## 2020-12-08 DIAGNOSIS — K602 Anal fissure, unspecified: Secondary | ICD-10-CM | POA: Insufficient documentation

## 2020-12-08 DIAGNOSIS — Z3202 Encounter for pregnancy test, result negative: Secondary | ICD-10-CM

## 2020-12-08 DIAGNOSIS — N8003 Adenomyosis of the uterus: Secondary | ICD-10-CM

## 2020-12-08 DIAGNOSIS — G43909 Migraine, unspecified, not intractable, without status migrainosus: Secondary | ICD-10-CM | POA: Insufficient documentation

## 2020-12-08 DIAGNOSIS — N800 Endometriosis of uterus: Secondary | ICD-10-CM

## 2020-12-08 DIAGNOSIS — G43101 Migraine with aura, not intractable, with status migrainosus: Secondary | ICD-10-CM

## 2020-12-08 LAB — POCT PREGNANCY, URINE: Preg Test, Ur: NEGATIVE

## 2020-12-08 MED ORDER — LEVONORGESTREL 19.5 MCG/DAY IU IUD: INTRAUTERINE_SYSTEM | Freq: Once | INTRAUTERINE | Status: AC

## 2020-12-08 NOTE — Progress Notes (Signed)
44 y.o. [id number] Married Hispanic female presents for insertion of levonorgestrel IUD due to desired contraception and also to hopefully treat heavy bleeding, cramping that is likely due to adenomyosis.  Pt is desirous of IUD treating this as well as for contraception.  Pt has been counseled about alternative forms of contraception including OCPs, progesterone options, sterilization procedures.   She feels IUD is the better option for her.  Pt has also been counseled about risks and benefits as well as complications.  Consent is obtained today.  All questions answered prior to start of procedure.    Current contraception: none Last STD testing:  08/2020.  Neg GC/CHl LMP:  Patient's last menstrual period was 11/29/2020 (exact date).  Patient Active Problem List   Diagnosis Date Noted  . Medical history non-contributory    Past Medical History:  Diagnosis Date  . Migraines    Current Outpatient Medications on File Prior to Visit  Medication Sig Dispense Refill  . Cyanocobalamin (VITAMIN B-12 PO) Take 1 tablet by mouth daily.    . Naproxen Sodium 220 MG CAPS Take by mouth. As needed    . Zinc Sulfate (ZINC 15 PO) Take 1 tablet by mouth daily.    Marland Kitchen acetaminophen (TYLENOL) 500 MG tablet Pain Reliever (acetaminophen) 500 mg tablet  TAKE 2 TABLETS BY MOUTH EVERY 6 HOURS FOR 10 DAYS (Patient not taking: Reported on 12/08/2020)    . doxycycline (VIBRAMYCIN) 100 MG capsule Take 1 capsule (100 mg total) by mouth 2 (two) times daily. One po bid x 7 days (Patient not taking: Reported on 09/21/2020) 14 capsule 0   No current facility-administered medications on file prior to visit.   Hydrocodone and Hydrocodone  Review of Systems  Respiratory: Negative.   Cardiovascular: Negative.   Gastrointestinal: Negative.   Genitourinary: Negative.    Vitals:   12/08/20 1045  BP: 120/75  Pulse: 81  Weight: 187 lb 4.8 oz (85 kg)  Height: 5' 2.5" (1.588 m)    Gen:  WNWF healthy female NAD Abdomen: soft,  non-tender Groin:  no inguinal nodes palpated  Pelvic exam: Vulva:  normal female genitalia Vagina:  normal vagina Cervix:  Non-tender, Negative CMT, no lesions or redness. Uterus:  normal shape, position and consistency   Procedure:  Speculum reinserted.  Cervix visualized and cleansed with Betadine x 3.  Paracervical block was not placed.  Single toothed tenaculum applied to anterior lip of cervix without difficulty.  Uterus sounded to 8.5cm.  Lot number: [id number].  Expiration:  01/2024.  IUD package was opened.  IUD and introducer passed to fundus and then withdrawn slightly before IUD was passed into endometrial cavity.  Introducer removed.  Strings cut to 2cm.  Tenaculum removed from cervix.  Minimal bleeding noted.  Pt tolerated the procedure well.  All instruments removed from vagina.  Assessment/Plan: 1. Encounter for initial prescription of intrauterine contraceptive device (IUD) - levonorgestrel (LILETTA) 19.5 MCG/DAY IUD placed today.  Pt aware removal due by 7 years - Recheck 4-6 weeks  2. Adenomyosis of uterus  3. Encounter for IUD insertion - Pregnancy, urine POC, negative today  4. Migraine with aura and with status migrainosus, not intractable - pt aware she should not use estrogen containing birth control methods

## 2020-12-08 NOTE — Patient Instructions (Signed)
Colocacin de un dispositivo intrauterino, cuidados posteriores Intrauterine Device Insertion, Care After  Lea esta informacin sobre cmo cuidarse despus del procedimiento. El mdico tambin podr darle instrucciones ms especficas. Comunquese con su mdico si tiene problemas o preguntas. Qu puedo esperar despus del procedimiento? Despus del procedimiento, es comn tener los siguientes sntomas:  Dolor y clicos abdominales.  Sangrado leve (manchado) o sangrado ms abundante, similar al perodo menstrual. Esto puede durar algunos das.  Dolor en la parte inferior de la espalda.  Mareos.  Dolores de cabeza.  Nuseas. Siga estas indicaciones en su casa:  Antes de tener actividad sexual nuevamente, revise la zona y asegrese de poder tocar los hilos del DIU. Debe poder tocar el extremo de los hilos por debajo de la abertura del cuello uterino. Si el hilo del DIU est en su lugar, puede retomar la actividad sexual. ? Si le colocaron un DIU hormonal despus de que hayan pasado 7das del inicio del perodo menstrual ms reciente, necesitar usar un mtodo anticonceptivo adicional durante los 7das posteriores a la colocacin del DIU. Pregntele al mdico si esto es vlido para su caso.  Siga controlando que el DIU est en su lugar; para ello, toque los hilos despus de cada perodo menstrual o una vez al mes.  Tome los medicamentos de venta libre y los recetados solamente como se lo haya indicado el mdico.  No conduzca ni use maquinaria pesada mientras toma analgsicos recetados.  Concurra a todas las visitas de control como se lo haya indicado el mdico. Esto es importante. Comunquese con un mdico si:  Tiene un sangrado ms abundante o dura ms de un ciclo menstrual normal.  Tiene fiebre.  Tiene clicos o un dolor abdominal que empeora o que no mejora con los medicamentos.  Siente un dolor abdominal nuevo o que no est en la misma zona en la que sinti antes los clicos  y el dolor.  Se siente mareada o dbil.  Le sale una secrecin anormal o con mal olor de la vagina.  Siente dolor durante la actividad sexual.  Tiene cualquiera de los siguientes problemas con los hilos del DIU: ? El hilo le molesta o lo lastima a usted o a su pareja sexual. ? No puede tocar el hilo. ? El hilo se ha alargado.  Puede sentir el DIU en la vagina.  Cree que puede estar embarazada o no tiene su perodo menstrual.  Cree que puede tener una ITS (infeccin de transmisin sexual). Solicite ayuda de inmediato si:  Tiene sntomas similares a los de la gripe.  Siente escalofros o tiene fiebre.  Puede sentir que el DIU se ha salido de lugar. Resumen  Despus del procedimiento, es comn tener dolor y clicos en el abdomen. Tambin es comn tener un sangrado leve (manchado) o un sangrado ms abundante, similar al perodo menstrual.  Siga controlando que el DIU est en su lugar; para ello, toque los hilos despus de cada perodo menstrual o una vez al mes.  Concurra a todas las visitas de control como se lo haya indicado el mdico. Esto es importante.  Comunquese con el mdico si tiene problemas con los hilos del DIU, por ejemplo, si el hilo se alarga o si le resulta molesto a usted o a su pareja sexual durante la actividad sexual. Esta informacin no tiene como fin reemplazar el consejo del mdico. Asegrese de hacerle al mdico cualquier pregunta que tenga. Document Revised: 09/18/2017 Document Reviewed: 09/18/2017 Elsevier Patient Education  2020 [employer]  

## 2021-01-06 ENCOUNTER — Encounter (HOSPITAL_BASED_OUTPATIENT_CLINIC_OR_DEPARTMENT_OTHER): Payer: Self-pay | Admitting: Emergency Medicine

## 2021-01-06 ENCOUNTER — Emergency Department (HOSPITAL_BASED_OUTPATIENT_CLINIC_OR_DEPARTMENT_OTHER)
Admission: EM | Admit: 2021-01-06 | Discharge: 2021-01-06 | Disposition: A | Discharge status: Home / Self Care | Payer: No Typology Code available for payment source | Attending: Emergency Medicine | Admitting: Emergency Medicine

## 2021-01-06 ENCOUNTER — Other Ambulatory Visit: Payer: Self-pay

## 2021-01-06 DIAGNOSIS — R1013 Epigastric pain: Secondary | ICD-10-CM | POA: Insufficient documentation

## 2021-01-06 DIAGNOSIS — K219 Gastro-esophageal reflux disease without esophagitis: Secondary | ICD-10-CM | POA: Insufficient documentation

## 2021-01-06 DIAGNOSIS — Z9049 Acquired absence of other specified parts of digestive tract: Secondary | ICD-10-CM | POA: Insufficient documentation

## 2021-01-06 DIAGNOSIS — G8929 Other chronic pain: Secondary | ICD-10-CM | POA: Insufficient documentation

## 2021-01-06 LAB — CBC
HCT: 40.6 % (ref 36.0–46.0)
Hemoglobin: 13.9 g/dL (ref 12.0–15.0)
MCH: 31.1 pg (ref 26.0–34.0)
MCHC: 34.2 g/dL (ref 30.0–36.0)
MCV: 90.8 fL (ref 80.0–100.0)
Platelets: 189 10*3/uL (ref 150–400)
RBC: 4.47 MIL/uL (ref 3.87–5.11)
RDW: 12.4 % (ref 11.5–15.5)
WBC: 7.1 10*3/uL (ref 4.0–10.5)
nRBC: 0 % (ref 0.0–0.2)

## 2021-01-06 LAB — COMPREHENSIVE METABOLIC PANEL
ALT: 15 U/L (ref 0–44)
AST: 22 U/L (ref 15–41)
Albumin: 3.9 g/dL (ref 3.5–5.0)
Alkaline Phosphatase: 69 U/L (ref 38–126)
Anion gap: 9 (ref 5–15)
BUN: 10 mg/dL (ref 6–20)
CO2: 23 mmol/L (ref 22–32)
Calcium: 8.5 mg/dL — ABNORMAL LOW (ref 8.9–10.3)
Chloride: 105 mmol/L (ref 98–111)
Creatinine, Ser: 0.55 mg/dL (ref 0.44–1.00)
GFR, Estimated: >60 mL/min (ref >60)
Glucose, Bld: 108 mg/dL — ABNORMAL HIGH (ref 70–99)
Potassium: 3.3 mmol/L — ABNORMAL LOW (ref 3.5–5.1)
Sodium: 137 mmol/L (ref 135–145)
Total Bilirubin: 0.2 mg/dL — ABNORMAL LOW (ref 0.3–1.2)
Total Protein: 7 g/dL (ref 6.5–8.1)

## 2021-01-06 LAB — LIPASE, BLOOD: Lipase: 29 U/L (ref 11–51)

## 2021-01-06 LAB — URINALYSIS, ROUTINE W REFLEX MICROSCOPIC
Bilirubin Urine: NEGATIVE
Glucose, UA: NEGATIVE mg/dL
Ketones, ur: NEGATIVE mg/dL
Nitrite: NEGATIVE
Protein, ur: NEGATIVE mg/dL
Specific Gravity, Urine: 1.025 (ref 1.005–1.030)
pH: 6 (ref 5.0–8.0)

## 2021-01-06 LAB — URINALYSIS, MICROSCOPIC (REFLEX): RBC / HPF: >50 RBC/hpf (ref 0–5)

## 2021-01-06 LAB — PREGNANCY, URINE: Preg Test, Ur: NEGATIVE

## 2021-01-06 MED ORDER — PANTOPRAZOLE SODIUM 20 MG PO TBEC: 20.0000 mg | DELAYED_RELEASE_TABLET | Freq: Every morning | ORAL | 1 refills | Status: DC

## 2021-01-06 MED ORDER — SUCRALFATE 1 G PO TABS: 1.0000 g | ORAL_TABLET | Freq: Three times a day (TID) | ORAL | 1 refills | Status: DC

## 2021-01-06 NOTE — ED Provider Notes (Incomplete)
[facility] EMERGENCY DEPARTMENT Provider Note   CSN: [id number] Arrival date & time: 01/06/21  2056     History Chief Complaint  Patient presents with  . Abdominal Pain    [name] is a 45 y.o. female.  HPI     Past Medical History:  Diagnosis Date  . Migraines     Patient Active Problem List   Diagnosis Date Noted  . Anal fissure 12/08/2020  . Gastroesophageal reflux disease 12/08/2020  . Migraines 12/08/2020    Past Surgical History:  Procedure Laterality Date  . CHOLECYSTECTOMY       OB History    Gravida  4   Para  4   Term  4   Preterm  0   AB  0   Living  4     SAB  0   IAB  0   Ectopic  0   Multiple      Live Births  4           Family History  Problem Relation Age of Onset  . Diabetes Father   . Hypertension Father   . Hyperlipidemia Father   . Hyperlipidemia Mother   . Hypertension Mother     Social History   Tobacco Use  . Smoking status: Never Smoker  . Smokeless tobacco: Never Used  Vaping Use  . Vaping Use: Never used  Substance Use Topics  . Alcohol use: Yes    Comment: rare  . Drug use: No    Home Medications Prior to Admission medications   Medication Sig Start Date End Date Taking? Authorizing Provider  pantoprazole (PROTONIX) 20 MG tablet Take 1 tablet (20 mg total) by mouth in the morning. 01/06/21  Yes Cristina Gong, PA-C  sucralfate (CARAFATE) 1 g tablet Take 1 tablet (1 g total) by mouth 4 (four) times daily -  with meals and at bedtime. 01/06/21  Yes Cristina Gong, PA-C  Cyanocobalamin (VITAMIN B-12 PO) Take 1 tablet by mouth daily.    [provider]  Naproxen Sodium 220 MG CAPS Take by mouth. As needed    [provider]  Zinc Sulfate (ZINC 15 PO) Take 1 tablet by mouth daily.    [provider]    Allergies    Hydrocodone and Hydrocodone  Review of Systems   Review of Systems  Physical Exam Updated Vital Signs BP 121/74   Pulse  69   Temp 97.8 F (36.6 C) (Oral)   Resp 17   Ht 5' 2.5" (1.588 m)   Wt 85 kg   SpO2 100%   BMI 33.73 kg/m   Physical Exam  ED Results / Procedures / Treatments   Labs (all labs ordered are listed, but only abnormal results are displayed) Labs Reviewed  COMPREHENSIVE METABOLIC PANEL - Abnormal; Notable for the following components:      Result Value   Potassium 3.3 (*)    Glucose, Bld 108 (*)    Calcium 8.5 (*)    Total Bilirubin 0.2 (*)    All other components within normal limits  URINALYSIS, ROUTINE W REFLEX MICROSCOPIC - Abnormal; Notable for the following components:   APPearance CLOUDY (*)    Hgb urine dipstick LARGE (*)    Leukocytes,Ua SMALL (*)    All other components within normal limits  URINALYSIS, MICROSCOPIC (REFLEX) - Abnormal; Notable for the following components:   Bacteria, UA RARE (*)    All other components within normal limits  LIPASE, BLOOD  CBC  PREGNANCY, URINE    EKG None  Radiology No results found.  Procedures Procedures (including critical care time)  Medications Ordered in ED Medications - No data to display  ED Course  I have reviewed the triage vital signs and the nursing notes.  Pertinent labs & imaging results that were available during my care of the patient were reviewed by me and considered in my medical decision making (see chart for details).    MDM Rules/Calculators/A&P                          *** Final Clinical Impression(s) / ED Diagnoses Final diagnoses:  Chronic abdominal pain    Rx / DC Orders ED Discharge Orders         Ordered    sucralfate (CARAFATE) 1 g tablet  3 times daily with meals & bedtime        01/06/21 2305    pantoprazole (PROTONIX) 20 MG tablet  Every morning        01/06/21 2305

## 2021-01-06 NOTE — ED Notes (Signed)
(  [name] [phone]) AVS provided to client utilizing interpreter services, pt teaching done on changing diet temporarily to more bland diet, increasing PO fluids. Discussed the two Rx's by the ED Provider as well. Opportunity for questions provided as well. Two Rx's and copy of AVS given to pt

## 2021-01-06 NOTE — ED Triage Notes (Signed)
Reports intermittent abdominal pain for the last three months with n/v.  Reports sometimes it is RUQ sometimes epigastric and sometimes in mid abdomen.

## 2021-01-06 NOTE — ED Provider Notes (Signed)
[facility] EMERGENCY DEPARTMENT Provider Note   CSN: [id number] Arrival date & time: 01/06/21  2056     History Chief Complaint  Patient presents with  . Abdominal Pain    [name] is a 45 y.o. female with past medical history of cholecystectomy who presents today for evaluation of ongoing upper abdominal pain.  She states that she had seen the health department and had been tested for H. pylori.  She finished treatment 5 days ago and her repeat test was negative however she continues to have symptoms primarily consisting of epigastric pain.  Her pain is intermittent and will move about her entire stomach.  She reports feeling like she is constipated.  She is taking a stool softener however does not know the name of the medicine.  She denies any recent fevers.  She has been taking Carafate twice daily as she was directed.  She denies any fevers.  She reports she is following bland diet.  She has not seen a GI doctor or specialist.  She states that the pain she is having today is in the same location and feels the same as her chronic ongoing abdominal pain however is worse.  She is concerned because she felt like she was supposed to be better after the medicine however continues to have symptoms.  She denies any fevers.  She is able to tolerate p.o. intake.  All interactions with patient performed through professional Spanish-speaking medical interpreter.  HPI     Past Medical History:  Diagnosis Date  . Migraines     Patient Active Problem List   Diagnosis Date Noted  . Anal fissure 12/08/2020  . Gastroesophageal reflux disease 12/08/2020  . Migraines 12/08/2020    Past Surgical History:  Procedure Laterality Date  . CHOLECYSTECTOMY       OB History    Gravida  4   Para  4   Term  4   Preterm  0   AB  0   Living  4     SAB  0   IAB  0   Ectopic  0   Multiple      Live Births  4           Family History  Problem Relation Age  of Onset  . Diabetes Father   . Hypertension Father   . Hyperlipidemia Father   . Hyperlipidemia Mother   . Hypertension Mother     Social History   Tobacco Use  . Smoking status: Never Smoker  . Smokeless tobacco: Never Used  Vaping Use  . Vaping Use: Never used  Substance Use Topics  . Alcohol use: Yes    Comment: rare  . Drug use: No    Home Medications Prior to Admission medications   Medication Sig Start Date End Date Taking? Authorizing Provider  pantoprazole (PROTONIX) 20 MG tablet Take 1 tablet (20 mg total) by mouth in the morning. 01/06/21  Yes Cristina Gong, PA-C  sucralfate (CARAFATE) 1 g tablet Take 1 tablet (1 g total) by mouth 4 (four) times daily -  with meals and at bedtime. 01/06/21  Yes Cristina Gong, PA-C  Cyanocobalamin (VITAMIN B-12 PO) Take 1 tablet by mouth daily.    [provider]  Naproxen Sodium 220 MG CAPS Take by mouth. As needed    [provider]  Zinc Sulfate (ZINC 15 PO) Take 1 tablet by mouth daily.    [provider]  Allergies    Hydrocodone and Hydrocodone  Review of Systems   Review of Systems  Constitutional: Negative for chills and fever.  HENT: Negative for congestion.   Respiratory: Negative for cough, chest tightness and shortness of breath.   Cardiovascular: Positive for chest pain (Occasional, only when she is also having abdominal pain).  Gastrointestinal: Positive for abdominal pain, constipation and nausea. Negative for blood in stool.  Genitourinary: Negative for dysuria, frequency and urgency.  Musculoskeletal: Negative for back pain and neck pain.  Skin: Negative for color change.  Neurological: Negative for weakness and headaches.  Psychiatric/Behavioral: Negative for confusion.  All other systems reviewed and are negative.   Physical Exam Updated Vital Signs BP 122/74   Pulse 84   Temp 98 F (36.7 C)   Resp 17   Ht 5' 2.5" (1.588 m)   Wt 85 kg   SpO2 99%   BMI 33.73  kg/m   Physical Exam Vitals and nursing note reviewed.  Constitutional:      General: She is not in acute distress.    Appearance: She is well-developed and well-nourished.  HENT:     Head: Normocephalic and atraumatic.  Eyes:     Conjunctiva/sclera: Conjunctivae normal.  Cardiovascular:     Rate and Rhythm: Normal rate and regular rhythm.     Heart sounds: Normal heart sounds. No murmur heard.   Pulmonary:     Effort: Pulmonary effort is normal. No respiratory distress.     Breath sounds: Normal breath sounds.  Abdominal:     General: Abdomen is flat.     Palpations: Abdomen is soft.     Tenderness: There is no abdominal tenderness. There is no guarding or rebound.     Hernia: No hernia is present.  Musculoskeletal:        General: No edema.     Cervical back: Neck supple.  Skin:    General: Skin is warm and dry.  Neurological:     Mental Status: She is alert.     Comments: Patient is awake and alert, answers questions appropriately.  Speech is not slurred.  Psychiatric:        Mood and Affect: Mood and affect normal.     ED Results / Procedures / Treatments   Labs (all labs ordered are listed, but only abnormal results are displayed) Labs Reviewed  COMPREHENSIVE METABOLIC PANEL - Abnormal; Notable for the following components:      Result Value   Potassium 3.3 (*)    Glucose, Bld 108 (*)    Calcium 8.5 (*)    Total Bilirubin 0.2 (*)    All other components within normal limits  URINALYSIS, ROUTINE W REFLEX MICROSCOPIC - Abnormal; Notable for the following components:   APPearance CLOUDY (*)    Hgb urine dipstick LARGE (*)    Leukocytes,Ua SMALL (*)    All other components within normal limits  URINALYSIS, MICROSCOPIC (REFLEX) - Abnormal; Notable for the following components:   Bacteria, UA RARE (*)    All other components within normal limits  LIPASE, BLOOD  CBC  PREGNANCY, URINE    EKG None  Radiology No results found.  Procedures Procedures  (including critical care time)  Medications Ordered in ED Medications - No data to display  ED Course  I have reviewed the triage vital signs and the nursing notes.  Pertinent labs & imaging results that were available during my care of the patient were reviewed by me and considered in my medical  decision making (see chart for details).    MDM Rules/Calculators/A&P                         Patient is a 45 year old woman who presents today for evaluation of over 3 months of intermittent, migratory chronic abdominal pain.  She has been seen by her PCP for this however has not seen GI. She is primarily here because she is having her same chronic abdominal pain and is concerned because she was under the impression that her pain would improve after she was treated for H. pylori however she continues to have pain. The only GI medication she is currently taking is Carafate twice a day. Labs are obtained and reviewed, CBC and CMP without significant acute derangements.  Lipase is not elevated.  On exam her abdomen is soft, nontender nondistended. Pregnancy test is negative, patient is currently menstruating accounting for the increased RBCs in her urine and denies specific urinary symptoms.  EKG was obtained given her reported epigastric chest pain however this is not currently present.  Given that patient's pain and symptoms have been ongoing for 3 months, her abdominal pain is migratory in nature and intermittent with reassuring physical exam, labs, and vitals I doubt a acute emergent cause for her condition.  She is started on increased Carafate along with Protonix.  We discussed the importance of bland diet.  CT scan is considered, however she does not appear to have acute abdomen.  She is status post cholecystectomy per chart review therefore doubt cholecystitis.  Lipase is not elevated, doubt pancreatitis.  Recommended PCP and ongoing GI follow-up. Return precautions are discussed.  Return  precautions were discussed with patient who states their understanding.  At the time of discharge patient denied any unaddressed complaints or concerns.  Patient is agreeable for discharge home.  Note: Portions of this report may have been transcribed using voice recognition software. Every effort was made to ensure accuracy; however, inadvertent computerized transcription errors may be present  Final Clinical Impression(s) / ED Diagnoses Final diagnoses:  Chronic abdominal pain    Rx / DC Orders ED Discharge Orders         Ordered    sucralfate (CARAFATE) 1 g tablet  3 times daily with meals & bedtime        01/06/21 2305    pantoprazole (PROTONIX) 20 MG tablet  Every morning        01/06/21 2305           Cristina Gong, PA-C 01/07/21 0048    Virgina Norfolk, DO 01/07/21 1457

## 2021-01-18 ENCOUNTER — Other Ambulatory Visit: Payer: Self-pay

## 2021-01-18 ENCOUNTER — Encounter: Payer: Self-pay | Admitting: Obstetrics & Gynecology

## 2021-01-18 ENCOUNTER — Ambulatory Visit (INDEPENDENT_AMBULATORY_CARE_PROVIDER_SITE_OTHER): Payer: Self-pay | Admitting: Obstetrics & Gynecology

## 2021-01-18 VITALS — BP 102/64 | HR 68 | Wt 188.8 lb

## 2021-01-18 DIAGNOSIS — Z30431 Encounter for routine checking of intrauterine contraceptive device: Secondary | ICD-10-CM

## 2021-01-18 DIAGNOSIS — Z975 Presence of (intrauterine) contraceptive device: Secondary | ICD-10-CM

## 2021-01-18 NOTE — Progress Notes (Signed)
45 y.o. [id number] Married Hispanic female presents for followed up after insertion of Liletta on 12/08/2020.  Pt reports she is doing well.  She is having spotting.  Denies cramping.  Denies vaginal discharge.  Reports she thought the bleeding was going to be worse than it is.  Husband things he can feel the strings.  LMP:  No LMP recorded. (Menstrual status: IUD).  Patient Active Problem List   Diagnosis Date Noted  . Anal fissure 12/08/2020  . Gastroesophageal reflux disease 12/08/2020  . Migraines 12/08/2020   Past Medical History:  Diagnosis Date  . Migraines    Current Outpatient Medications on File Prior to Visit  Medication Sig Dispense Refill  . pantoprazole (PROTONIX) 20 MG tablet Take 1 tablet (20 mg total) by mouth in the morning. 30 tablet 1  . sucralfate (CARAFATE) 1 g tablet Take 1 tablet (1 g total) by mouth 4 (four) times daily -  with meals and at bedtime. 120 tablet 1  . Cyanocobalamin (VITAMIN B-12 PO) Take 1 tablet by mouth daily. (Patient not taking: Reported on 01/18/2021)    . Naproxen Sodium 220 MG CAPS Take by mouth. As needed (Patient not taking: Reported on 01/18/2021)    . Zinc Sulfate (ZINC 15 PO) Take 1 tablet by mouth daily. (Patient not taking: Reported on 01/18/2021)     No current facility-administered medications on file prior to visit.   All:  Hydrocodone  Review of Systems  All other systems reviewed and are negative.  Vitals:   01/18/21 1110  Weight: 188 lb 12.8 oz (85.6 kg)    Gen:  WNWF healthy female NAD Abdomen: soft, non-tender Groin:  no inguinal nodes palpated  Pelvic exam: Vulva:  normal female genitalia Vagina:  normal vagina Cervix:  Non-tender, Negative CMT, no lesions or redness.  IUD string noted.  About 3cm so cut to 2cm  Uterus:  normal shape, position and consistency   Assessment/Plan:  1. IUD check up - strings cut to 2cm.  Bleeding is appropriate.  Typical timing of irregular bleeding discussed.  Questions answered. -  Pt aware IUD has 7 year indication - she would like to recheck 5-6 months.  Will plan with AEX.

## 2021-03-01 ENCOUNTER — Other Ambulatory Visit: Payer: Self-pay

## 2021-03-01 DIAGNOSIS — Z1231 Encounter for screening mammogram for malignant neoplasm of breast: Secondary | ICD-10-CM

## 2021-04-02 ENCOUNTER — Encounter: Payer: Self-pay | Admitting: General Practice

## 2021-04-23 IMAGING — DX DG CHEST 1V PORT
1 series · 1 of 1 positions shown · non-contrast
Comparison: Portable exam 4444 hours compared to 01/15/2017

CLINICAL DATA: Ongoing LEFT side chest pain for 3 weeks since being
diagnosed with [id number], shortness of breath

EXAM:
PORTABLE CHEST 1 VIEW

[chest ap]
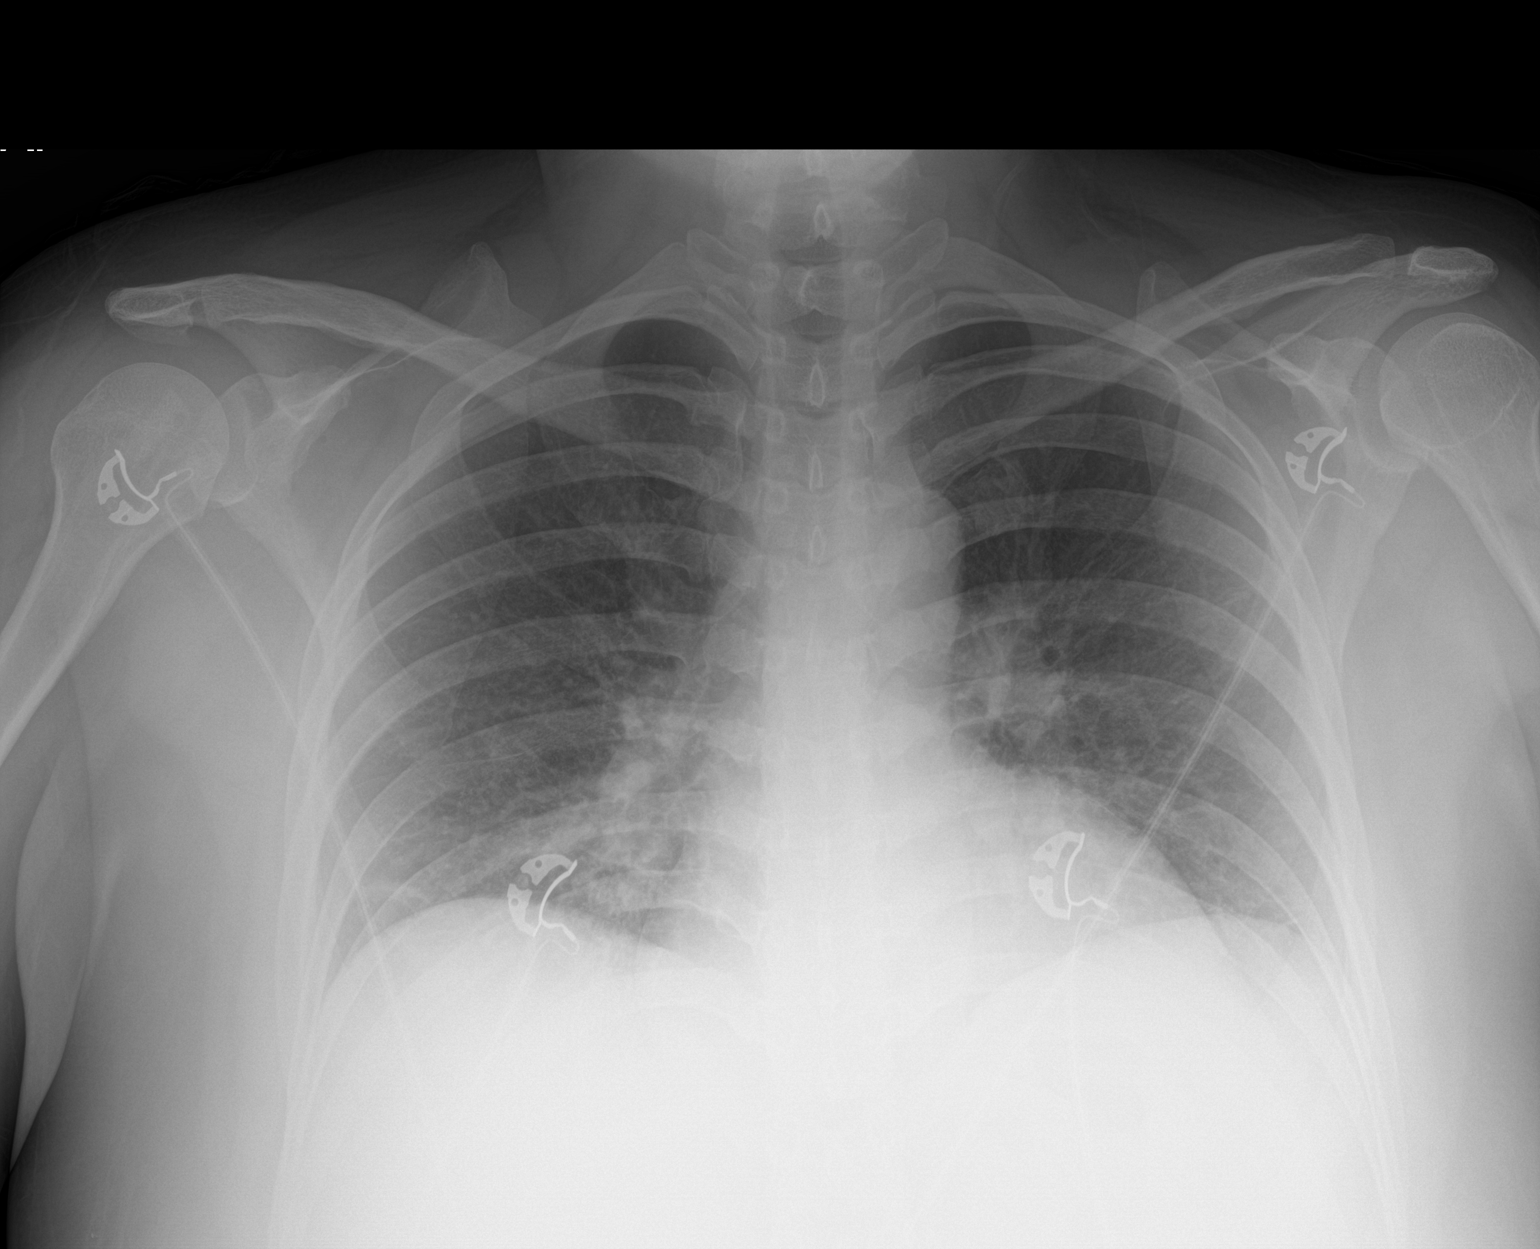

[1 of 1 positions shown; findings below may reference images not displayed]

FINDINGS: Normal heart size, mediastinal contours, and pulmonary vascularity.

RIGHT basilar infiltrate consistent with pneumonia.

Remaining lungs grossly clear.

Minimal accompanying bibasilar atelectasis.

No pleural effusion or pneumothorax.
IMPRESSION: RIGHT lower lobe pneumonia.

Minimal bibasilar atelectasis.

## 2021-05-01 ENCOUNTER — Ambulatory Visit: Payer: No Typology Code available for payment source

## 2021-07-03 ENCOUNTER — Ambulatory Visit: Payer: Self-pay | Admitting: *Deleted

## 2021-07-03 ENCOUNTER — Other Ambulatory Visit: Payer: Self-pay

## 2021-07-03 VITALS — BP 108/72 | Wt 194.6 lb

## 2021-07-03 DIAGNOSIS — Z01419 Encounter for gynecological examination (general) (routine) without abnormal findings: Secondary | ICD-10-CM

## 2021-07-03 NOTE — Progress Notes (Signed)
[name] is a 45 y.o. [phone] female who presents to [facility] clinic today with complaint of left outer breat pain for 7 years that comes and goes. Patient had a diagnostic mammogram 08/15/2015 to work up the pain that was negative. Per patient the pain is the same as prior to the diagnostic mammogram. Patient rates the pain at a 8 out of 10.   Pap Smear: Pap smear completed today. Last Pap smear was 04/03/2020 at [facility] clinic and was abnormal - ASCUS with negative HPV . Per patient has a history of an abnormal Pap smear in 2016 that a repeat Pap smear was completed for follow-up. Patient stated all Pap smears have been normal since. Last Pap smear result is available in Epic.   Physical exam: Breasts Breasts symmetrical. No skin abnormalities bilateral breasts. No nipple retraction bilateral breasts. No nipple discharge bilateral breasts. No lymphadenopathy. No lumps palpated bilateral breasts. No complaints of pain or tenderness on exam.  MS DIGITAL SCREENING TOMO BILATERAL  Result Date: 04/06/2020 CLINICAL DATA:  Screening. EXAM: DIGITAL SCREENING BILATERAL MAMMOGRAM WITH TOMO AND CAD COMPARISON:  Previous exam(s). ACR Breast Density Category b: There are scattered areas of fibroglandular density. FINDINGS: There are no findings suspicious for malignancy. Images were processed with CAD. IMPRESSION: No mammographic evidence of malignancy. A result letter of this screening mammogram will be mailed directly to the patient. RECOMMENDATION: Screening mammogram in one year. (Code:SM-B-01Y) BI-RADS CATEGORY  1: Negative. Electronically Signed   By: Ted Mcalpine M.D.   On: 04/06/2020 12:43   MS DIGITAL SCREENING TOMO BILATERAL  Result Date: 02/11/2019 CLINICAL DATA:  Screening. EXAM: DIGITAL SCREENING BILATERAL MAMMOGRAM WITH TOMO AND CAD COMPARISON:  Previous exam(s). ACR Breast Density Category b: There are scattered areas of fibroglandular density. FINDINGS: There are no findings  suspicious for malignancy. Images were processed with CAD. IMPRESSION: No mammographic evidence of malignancy. A result letter of this screening mammogram will be mailed directly to the patient. RECOMMENDATION: Screening mammogram in one year. (Code:SM-B-01Y) BI-RADS CATEGORY  1: Negative. Electronically Signed   By: Harmon Pier M.D.   On: 02/11/2019 16:30         Pelvic/Bimanual Ext Genitalia No lesions, no swelling and no discharge observed on external genitalia.        Vagina Vagina pink and normal texture. No lesions or discharge observed in vagina.        Cervix Cervix is present. Cervix pink and of normal texture. No discharge observed. Cervix friable.   Uterus Uterus is present and palpable. Uterus in normal position and normal size.        Adnexae Bilateral ovaries present and palpable. No tenderness on palpation.         Rectovaginal No rectal exam completed today since patient had no rectal complaints. No skin abnormalities observed on exam.     Smoking History: Patient has never smoked.   Patient Navigation: Patient education provided. Access to services provided for patient through [place] program. Spanish interpreter Natale Lay from [facility] provided.   Breast and Cervical Cancer Risk Assessment: Patient does not have family history of breast cancer, known genetic mutations, or radiation treatment to the chest before age 45. Patient does not have history of cervical dysplasia, immunocompromised, or DES exposure in-utero.  Risk Assessment     Risk Scores       07/03/2021 04/06/2020   Last edited by: Narda Rutherford, LPN McGill, Sherie Demetrius Charity, LPN   5-year risk: 0.4 % 0.4 %  Lifetime risk: 6.9 % 6.9 %            A: BCCCP exam with pap smear Complaint of intermittent left breast pain.  P: Referred patient to the [facility] for a screening mammogram on the mobile unit. Appointment scheduled Thursday, July 05, 2021 at 1550.  Priscille Heidelberg,  RN 07/03/2021 3:38 PM

## 2021-07-03 NOTE — Patient Instructions (Signed)
Explained breast self awareness with Donita Brooks. Pap smear completed today. Let patient know that next Pap smear will be due based on the result of today's Pap smear. Referred patient to the [facility] for a screening mammogram on the mobile unit. Appointment scheduled Thursday, July 05, 2021 at 1550. Patient aware of appointment and will be there. Let patient know will follow up with her within the next couple weeks with results of her Pap smear by phone. Donita Brooks verbalized understanding.  Kortney Potvin, Kathaleen Maser, RN 3:39 PM

## 2021-07-05 ENCOUNTER — Other Ambulatory Visit: Payer: Self-pay

## 2021-07-05 ENCOUNTER — Ambulatory Visit
Admission: RE | Admit: 2021-07-05 | Discharge: 2021-07-05 | Disposition: A | Discharge status: Home / Self Care | Payer: No Typology Code available for payment source | Source: Ambulatory Visit | Attending: Obstetrics and Gynecology | Admitting: Obstetrics and Gynecology

## 2021-07-05 LAB — CYTOLOGY - PAP
Comment: NEGATIVE
Diagnosis: NEGATIVE
High risk HPV: NEGATIVE

## 2021-07-09 ENCOUNTER — Telehealth: Payer: Self-pay

## 2021-07-09 NOTE — Telephone Encounter (Signed)
Late documentation. Via Natale Lay, Spanish Interpreter [facility]), Patient informed negative Pap/HPV results, next pap due in 3 years. Patient verbalized understanding.

## 2021-07-11 ENCOUNTER — Other Ambulatory Visit: Payer: Self-pay | Admitting: Obstetrics and Gynecology

## 2021-07-11 DIAGNOSIS — R928 Other abnormal and inconclusive findings on diagnostic imaging of breast: Secondary | ICD-10-CM

## 2021-08-06 ENCOUNTER — Ambulatory Visit
Admission: RE | Admit: 2021-08-06 | Discharge: 2021-08-06 | Disposition: A | Discharge status: Home / Self Care | Payer: No Typology Code available for payment source | Source: Ambulatory Visit | Attending: Obstetrics and Gynecology | Admitting: Obstetrics and Gynecology

## 2021-08-06 ENCOUNTER — Ambulatory Visit: Payer: No Typology Code available for payment source

## 2021-08-06 ENCOUNTER — Other Ambulatory Visit: Payer: Self-pay

## 2021-08-06 DIAGNOSIS — R928 Other abnormal and inconclusive findings on diagnostic imaging of breast: Secondary | ICD-10-CM

## 2022-01-24 ENCOUNTER — Other Ambulatory Visit: Payer: Self-pay | Admitting: *Deleted

## 2022-01-24 ENCOUNTER — Encounter: Payer: Self-pay | Admitting: *Deleted

## 2022-01-28 ENCOUNTER — Ambulatory Visit: Payer: Self-pay | Admitting: Psychiatry

## 2022-02-20 ENCOUNTER — Ambulatory Visit: Payer: Self-pay | Admitting: Psychiatry

## 2022-03-14 NOTE — Progress Notes (Signed)
? ?Referring:  ?[place] Valera CastleCastillo Matos, Elaine, [place]-C ?[address], [address] ?[place] [id number] ? ?PCP: ?Jonetta OsgoodJasper, Christian A, MD ? ?Neurology was asked to evaluate Patterson HammersmithMaria Hebreo Cabrera for a chief complaint of headaches.  Our recommendations of care will be communicated by shared medical record.   ? ?CC:  headaches ? ?HPI:  ?Medical co-morbidities: asthma ? ?The patient presents for evaluation of headaches which began since she was 46 years old. They have worsened over time. Currently she has a migraine once every 6 months. Migraines are described as left sided squeezing and throbbing pain with associated numbness in the face and hands. She will also feel weak on the left side of her face and drop things. Headaches are associated with photophobia, phonophobia, and nausea. They can last 1-2 days at a time. She has tried Aleve for her headaches which usually does not help. ? ?Last bad [facility] 2022[facility]. She was started on amitriptyline at that time for headache prevention which has helped. Also started Imitrex 25 mg as needed which does help but makes her sleepy. ? ?She will occasionally have hand numbness which is not associated with her headaches. Had a history of neck pain but denies radicular pains in the upper extremities. ? ?Headache History: ?Onset: 46 years old ?Triggers: no ?Aura: left face numbness and weakness, drops things with both hands ?Location: left occiput, temple ?Quality/Description: squeezing, throbbing ?Associated Symptoms: ? Photophobia: yes ? Phonophobia: yes ? Nausea: sometimes ?Worse with activity?: yes ?Duration of headaches: 1-2 days ? ? ?Headache days per month: 1 ?Headache free days per month: 29 ? ?Current Treatment: ?Abortive ?Imitrex 25 mg PRN ? ?Preventative ?Elavil 10 mg daily ? ?Prior Therapies                                 ?Imitrex 25 mg PRN ?Naproxen 500 mg PRN ?Elavil 10 mg daily ? ? ?LABS: ?CBC ?   ?Component Value Date/Time   ? WBC 7.1 01/06/2021 2127  ? RBC 4.47 01/06/2021 2127  ? HGB 13.9 01/06/2021 2127  ? HCT 40.6 01/06/2021 2127  ? PLT 189 01/06/2021 2127  ? MCV 90.8 01/06/2021 2127  ? MCH 31.1 01/06/2021 2127  ? MCHC 34.2 01/06/2021 2127  ? RDW 12.4 01/06/2021 2127  ? LYMPHSABS 2.0 01/31/2017 1825  ? MONOABS 0.4 01/31/2017 1825  ? EOSABS 0.2 01/31/2017 1825  ? BASOSABS 0.0 01/31/2017 1825  ? ?CMP Latest Ref Rng & Units 01/06/2021 05/04/2020 01/31/2017  ?Glucose 70 - 99 mg/dL 664(Q108(H) 92 034(V102(H)  ?BUN 6 - 20 mg/dL 10 12 15   ?Creatinine 0.44 - 1.00 mg/dL 4.250.55 9.560.54 3.870.54  ?Sodium 135 - 145 mmol/L 137 138 137  ?Potassium 3.5 - 5.1 mmol/L 3.3(L) 4.2 4.4  ?Chloride 98 - 111 mmol/L 105 107 106  ?CO2 22 - 32 mmol/L 23 22 24   ?Calcium 8.9 - 10.3 mg/dL 5.6(E8.5(L) 3.3(I8.8(L) 9.5(J8.5(L)  ?Total Protein 6.5 - 8.1 g/dL 7.0 - -  ?Total Bilirubin 0.3 - 1.2 mg/dL 8.8(C0.2(L) - -  ?Alkaline Phos 38 - 126 U/L 69 - -  ?AST 15 - 41 U/L 22 - -  ?ALT 0 - 44 U/L 15 - -  ? ? ? ?IMAGING:  ?CTH 12/06/21: unremarkable ? ? ? ?Current Outpatient Medications on File Prior to Visit  ?Medication Sig Dispense Refill  ? albuterol (VENTOLIN HFA) 108 (90 Base) MCG/ACT inhaler Inhale into the  lungs every 6 (six) hours as needed for wheezing or shortness of breath.    ? fluticasone (FLOVENT HFA) 110 MCG/ACT inhaler Inhale 1 puff into the lungs 2 (two) times daily.    ? naproxen (NAPROSYN) 500 MG tablet naproxen 500 mg tablet ? Take 1 tablet twice a day by oral route with meals for 30 days.    ? sucralfate (CARAFATE) 1 g tablet Take 1 tablet (1 g total) by mouth 4 (four) times daily -  with meals and at bedtime. (Patient not taking: Reported on 03/15/2022) 120 tablet 1  ? ?No current facility-administered medications on file prior to visit.  ? ? ? ?Allergies: ?Allergies  ?Allergen Reactions  ? Hydrocodone Rash  ? ? ?Family History: ?Migraine or other headaches in the family:  mother ?Aneurysms in a first degree relative:  no ?Brain tumors in the family:  no ?Other neurological illness in the  family:   no ? ?Past Medical History: ?Past Medical History:  ?Diagnosis Date  ? Migraines   ? ? ?Past Surgical History ?Past Surgical History:  ?Procedure Laterality Date  ? CHOLECYSTECTOMY    ? ? ?Social History: ?Social History  ? ?Tobacco Use  ? Smoking status: Never  ? Smokeless tobacco: Never  ?Vaping Use  ? Vaping Use: Never used  ?Substance Use Topics  ? Alcohol use: Yes  ?  Comment: rare  ? Drug use: No  ? ? ?ROS: ?Negative for fevers, chills. Positive for headaches, numbness, weakness. All other systems reviewed and negative unless stated otherwise in HPI. ? ? ?Physical Exam:  ? ?Vital Signs: ?BP 108/73 (BP Location: Right Arm, Patient Position: Sitting)   Pulse 73   Ht 5\' 2"  (1.575 m)   Wt 204 lb (92.5 kg)   BMI 37.31 kg/m?  ?GENERAL: well appearing,in no acute distress,alert ?SKIN:  Color, texture, turgor normal. No rashes or lesions ?HEAD:  Normocephalic/atraumatic. ?CV:  RRR ?RESP: Normal respiratory effort ?MSK: no tenderness to palpation over occiput, neck, or shoulders ? ?NEUROLOGICAL: ?Mental Status: Alert, oriented to person, place and time,Follows commands ?Cranial Nerves: PERRL, visual fields intact to confrontation, extraocular movements intact, facial sensation intact, no facial droop or ptosis, hearing grossly intact, no dysarthria ?Motor: muscle strength 5/5 both upper and lower extremities ?Reflexes: 2+ throughout ?Sensation: intact to light touch all 4 extremities ?Coordination: Finger-to- nose-finger intact bilaterally ?Gait: normal-based ? ? ?IMPRESSION: ?46 year old female with a history of asthma who presents for evaluation of migraines. She does have an aura of left-sided numbness and weakness with her migraines. She has never had an MRI, will order one today as she does have occasional numbness which is not associated with headache. Would avoid triptans as she does report weakness with her aura. Sample for Nurtec provided in the office today. She does not have insurance. Will see  if it is possible to get her enrolled in a savings program. If she is unable to get financial assistance for Nurtec may consider an NSAID such as diclofenac for rescue. Will refer to neck PT to help with her cervicalgia. ? ?PLAN: ?-MRI brain ?-Prevention: Continue amitriptyline 10 mg QHS ?-Rescue: Stop Imitrex. Start Nurtec 75 mg PRN ?-Referral to neck PT ?-next steps: consider diclofenac if unable to get financial assistance for Nurtec ? ?I spent a total of 32 minutes chart reviewing and counseling the patient. Headache education was done. Discussed treatment options including preventive and acute medications, and physical therapy. Discussed medication side effects, adverse reactions and drug interactions. Written  educational materials and patient instructions outlining all of the above were given. ? ?Follow-up: 6 months ? ? ?Ocie Doyne, MD ?03/15/2022   ?11:25 AM ? ? ?

## 2022-03-15 ENCOUNTER — Encounter: Payer: Self-pay | Admitting: Psychiatry

## 2022-03-15 ENCOUNTER — Ambulatory Visit (INDEPENDENT_AMBULATORY_CARE_PROVIDER_SITE_OTHER): Payer: Self-pay | Admitting: Psychiatry

## 2022-03-15 VITALS — BP 108/73 | HR 73 | Ht 62.0 in | Wt 204.0 lb

## 2022-03-15 DIAGNOSIS — R531 Weakness: Secondary | ICD-10-CM

## 2022-03-15 DIAGNOSIS — M542 Cervicalgia: Secondary | ICD-10-CM

## 2022-03-15 DIAGNOSIS — G43109 Migraine with aura, not intractable, without status migrainosus: Secondary | ICD-10-CM

## 2022-03-15 MED ORDER — AMITRIPTYLINE HCL 10 MG PO TABS
ORAL_TABLET | ORAL | 3 refills | Status: DC
Start: 2022-03-15 — End: 2024-03-29

## 2022-03-15 MED ORDER — NURTEC 75 MG PO TBDP: 75.0000 mg | ORAL_TABLET | ORAL | 3 refills | Status: DC | PRN

## 2022-03-15 NOTE — Patient Instructions (Addendum)
Physical therapy for the neck ?Start Nurtec as needed for migraine. Take one pill at the onset of migraine, may repeat a dose in 24 hours if headache persists. You can go to [email or web address] to see if you qualify for the savings program ?Please stop sumatriptan ?MRI brain ?

## 2022-03-18 ENCOUNTER — Telehealth: Payer: Self-pay | Admitting: Psychiatry

## 2022-03-18 NOTE — Telephone Encounter (Signed)
self pay order sent to GI, they will reach out to the patient to schedule.  

## 2022-03-21 ENCOUNTER — Telehealth: Payer: Self-pay | Admitting: Psychiatry

## 2022-03-21 ENCOUNTER — Other Ambulatory Visit: Payer: Self-pay | Admitting: Psychiatry

## 2022-03-21 MED ORDER — DICLOFENAC POTASSIUM 50 MG PO TABS: ORAL_TABLET | ORAL | 3 refills | Status: DC

## 2022-03-21 NOTE — Telephone Encounter (Signed)
Rx for diclofenac sent to her pharmacy. She can take this as needed in place of the Nurtec

## 2022-03-21 NOTE — Telephone Encounter (Signed)
Pt's daughter has called to report that the Rimegepant Sulfate (NURTEC) 57 MG TBDP will cost $1000.00 that is not an option for pt, daughter would like to know if there are other options for pt ?

## 2022-03-21 NOTE — Telephone Encounter (Signed)
Called [name] and informed her Dr Delena Bali sent in new Rx for diclofenac, Take 1-2 pills as needed for migraine headache. Daughter  verbalized understanding, appreciation. ? ?

## 2022-03-25 ENCOUNTER — Ambulatory Visit
Admission: RE | Admit: 2022-03-25 | Discharge: 2022-03-25 | Disposition: A | Discharge status: Home / Self Care | Payer: No Typology Code available for payment source | Source: Ambulatory Visit | Attending: Psychiatry | Admitting: Psychiatry

## 2022-03-25 ENCOUNTER — Other Ambulatory Visit: Payer: Self-pay

## 2022-03-25 DIAGNOSIS — R531 Weakness: Secondary | ICD-10-CM

## 2022-03-25 MED ORDER — GADOBENATE DIMEGLUMINE 529 MG/ML IV SOLN
19.0000 mL | Freq: Once | INTRAVENOUS | Status: AC | PRN
  Administered 2022-03-25: 19 mL via INTRAVENOUS

## 2022-03-26 ENCOUNTER — Telehealth: Payer: Self-pay

## 2022-03-26 NOTE — Telephone Encounter (Signed)
-----   Message from Ocie Doyne, MD sent at 03/26/2022 10:18 AM EDT ----- ?Brain MRI is normal ?

## 2022-03-26 NOTE — Telephone Encounter (Signed)
I attempted to reach the pt via interpreter service but Pt was not available and vm was not set up.  ?Will try again later.  ?Interpreter's name Jed Limerick ID # [id number] ?

## 2022-03-28 NOTE — Telephone Encounter (Signed)
PHONE RM CAN RELAY ? ?Attempted to contact pt again, no answer  ? ?Please advise pt MRI was normal, no concerns.  ?

## 2022-05-24 ENCOUNTER — Telehealth: Payer: Self-pay | Admitting: Psychiatry

## 2022-05-24 NOTE — Telephone Encounter (Signed)
Pt would like a copy of her balance summary mailed to her, verified address on file is correct.

## 2022-07-18 ENCOUNTER — Other Ambulatory Visit: Payer: Self-pay

## 2022-07-18 DIAGNOSIS — Z1231 Encounter for screening mammogram for malignant neoplasm of breast: Secondary | ICD-10-CM

## 2022-09-16 NOTE — Progress Notes (Unsigned)
   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   At her last visit, brain MRI was ordered. Nurtec sample was provided. Referral to neck PT was placed.  Interval History: Headaches initially improved after her last visit, but they started to return 2 months ago. She has 1-3 headaches per week. She has also had a lot of tightness in her neck and jaw. She was not able to afford Nurtec, so she was prescribed diclofenac for rescue. This helped at first, but has been less effective recently. Amitriptyline has been making her drowsy in the morning.  She also reports brain fog and short term memory difficulty which has worsened since her last visit.  Brain MRI 03/25/22 was unremarkable.  Headache days per month: 12 Headache free days per month: 18  Current Headache Regimen: Preventative: none Abortive: diclofenac 50-100 mg PRN   Prior Therapies                                  Imitrex 25 mg PRN Diclofenac 50-100 mg PRN Naproxen 500 mg PRN Elavil 10 mg daily - drowsiness  Physical Exam:   Vital Signs: BP 115/79   Pulse 84   Ht 5\' 2"  (1.575 m)   Wt 202 lb (91.6 kg)   BMI 36.95 kg/m  GENERAL:  well appearing, in no acute distress, alert  SKIN:  Color, texture, turgor normal. No rashes or lesions HEAD:  Normocephalic/atraumatic. RESP: normal respiratory effort MSK:  No gross joint deformities.   NEUROLOGICAL: Mental Status: Alert, oriented to person, place and time, Follows commands, and Speech fluent and appropriate. Cranial Nerves: PERRL, face symmetric, no dysarthria, hearing grossly intact Motor: moves all extremities equally Gait: normal-based.  IMPRESSION: 46 year old female with a history of asthma who presents for follow up of migraines. Brain MRI was normal. Headaches initially improved, but started to worsen  again over the past 2 months. Amitriptyline has been making her drowsy even at low doses. Will switch this to Topamax for headache prevention. Will start Robaxin as needed for headaches and muscle spasms.  PLAN: -TSH, B12 levels -Preventive: Start Topamax 25 mg QHS, increase by 25 mg weekly up to 100 mg QHS -Rescue: continue diclofenac 50-100 mg PRN, Start Robaxin 750 mg PRN for muscle spasms   Follow-up: 3 months  I spent a total of 20 minutes on the date of the service. Headache education was done. Discussed treatment options including preventive and acute medications. Discussed medication side effects, adverse reactions and drug interactions. Written educational materials and patient instructions outlining all of the above were given.  Genia Harold, MD 09/17/22 11:15 AM

## 2022-09-17 ENCOUNTER — Encounter: Payer: Self-pay | Admitting: Psychiatry

## 2022-09-17 ENCOUNTER — Ambulatory Visit (INDEPENDENT_AMBULATORY_CARE_PROVIDER_SITE_OTHER): Payer: Self-pay | Admitting: Psychiatry

## 2022-09-17 VITALS — BP 115/79 | HR 84 | Ht 62.0 in | Wt 202.0 lb

## 2022-09-17 DIAGNOSIS — R413 Other amnesia: Secondary | ICD-10-CM

## 2022-09-17 DIAGNOSIS — G43109 Migraine with aura, not intractable, without status migrainosus: Secondary | ICD-10-CM

## 2022-09-17 MED ORDER — TOPIRAMATE 25 MG PO TABS
ORAL_TABLET | ORAL | 6 refills | Status: DC
Start: 2022-09-17 — End: 2024-03-29

## 2022-09-17 MED ORDER — METHOCARBAMOL 750 MG PO TABS: 750.0000 mg | ORAL_TABLET | Freq: Three times a day (TID) | ORAL | 6 refills | Status: DC | PRN

## 2022-09-17 NOTE — Patient Instructions (Addendum)
Stop amitriptyline.  Start Topamax for headache prevention. Take 25 mg (1 pill) at bedtime for one week, then increase to 50 mg (2 pills) at bedtime for one week, then take 75 mg (3 pills) at bedtime for one week, then take 100 mg (4 pills) at bedtime. Let me know once you reach this dose and I will send in 100 mg tablets to your pharmacy  Take methocarbamol up to 3 times a day as needed for muscle spasms  Blood work today

## 2022-09-18 ENCOUNTER — Telehealth: Payer: Self-pay

## 2022-09-18 LAB — VITAMIN B12: Vitamin B-12: 528 pg/mL (ref 232–1245)

## 2022-09-18 LAB — TSH: TSH: 2.19 u[IU]/mL (ref 0.450–4.500)

## 2022-09-18 NOTE — Telephone Encounter (Signed)
-----   Message from Genia Harold, MD sent at 09/18/2022  8:21 AM EDT ----- B12 and thyroid levels are normal

## 2022-09-18 NOTE — Telephone Encounter (Signed)
Contacted pt with language line, pt did not answer. Will try again later.

## 2022-09-19 NOTE — Telephone Encounter (Signed)
Contacted pt with language line, pt did not answer.

## 2022-10-10 ENCOUNTER — Ambulatory Visit: Payer: Self-pay

## 2022-10-30 ENCOUNTER — Other Ambulatory Visit: Payer: Self-pay

## 2022-10-30 DIAGNOSIS — Z1231 Encounter for screening mammogram for malignant neoplasm of breast: Secondary | ICD-10-CM

## 2022-11-06 ENCOUNTER — Other Ambulatory Visit: Payer: Self-pay

## 2022-11-06 DIAGNOSIS — Z1231 Encounter for screening mammogram for malignant neoplasm of breast: Secondary | ICD-10-CM

## 2022-12-16 NOTE — Patient Instructions (Incomplete)

## 2022-12-16 NOTE — Progress Notes (Deleted)
No chief complaint on file.   HISTORY OF PRESENT ILLNESS:  12/16/22 ALL:  Rogue Jury Bryna Solberg is a 46 y.o. female here today for follow up for hemiplegic migraines. She was last seen by Dr Billey Gosling 08/2022. Amitriptyline was stopped due to side effects of grogginess and topiramate 100mg  daily started. Diclofenac continued and methocarbamol added for abortive therapy. 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    At her last visit, brain MRI was ordered. Nurtec sample was provided. Referral to neck PT was placed.   Interval History: Headaches initially improved after her last visit, but they started to return 2 months ago. She has 1-3 headaches per week. She has also had a lot of tightness in her neck and jaw. She was not able to afford Nurtec, so she was prescribed diclofenac for rescue. This helped at first, but has been less effective recently. Amitriptyline has been making her drowsy in the morning.   She also reports brain fog and short term memory difficulty which has worsened since her last visit.   Brain MRI 03/25/22 was unremarkable.   Headache days per month: 12 Headache free days per month: 18   Current Headache Regimen: Preventative: none Abortive: diclofenac 50-100 mg PRN     Prior Therapies                                  Imitrex 25 mg PRN Diclofenac 50-100 mg PRN Naproxen 500 mg PRN Elavil 10 mg daily - drowsiness   REVIEW OF SYSTEMS: Out of a complete 14 system review of symptoms, the patient complains only of the following symptoms, and all other reviewed systems are negative.   ALLERGIES: Allergies  Allergen Reactions   Hydrocodone Rash     HOME MEDICATIONS: Outpatient Medications Prior to Visit  Medication Sig Dispense Refill   albuterol (VENTOLIN HFA) 108 (90 Base) MCG/ACT inhaler Inhale into the lungs every 6 (six) hours as needed  for wheezing or shortness of breath.     amitriptyline (ELAVIL) 10 MG tablet Take one pill at bedtime 30 tablet 3   diclofenac (CATAFLAM) 50 MG tablet Take 1-2 pills as needed for migraine headache 30 tablet 3   fluticasone (FLOVENT HFA) 110 MCG/ACT inhaler Inhale 1 puff into the lungs 2 (two) times daily.     methocarbamol (ROBAXIN-750) 750 MG tablet Take 1 tablet (750 mg total) by mouth every 8 (eight) hours as needed for muscle spasms. 30 tablet 6   naproxen (NAPROSYN) 500 MG tablet naproxen 500 mg tablet  Take 1 tablet twice a day by oral route with meals for 30 days.     topiramate (TOPAMAX) 25 MG tablet Start Topamax for headache prevention. Take 25 mg (1 pill) at bedtime for one week, then increase to 50 mg (2 pills) at bedtime for one week, then take 75 mg (3 pills) at bedtime for one week, then take 100 mg (4 pills) at bedtime and continue at that dose 120 tablet 6   No facility-administered medications prior to visit.     PAST MEDICAL HISTORY: Past Medical History:  Diagnosis Date   Migraines      PAST SURGICAL HISTORY: Past Surgical History:  Procedure Laterality Date   CHOLECYSTECTOMY       FAMILY HISTORY: Family History  Problem  Relation Age of Onset   Migraines Mother    Hyperlipidemia Mother    Hypertension Mother    Diabetes Father    Hypertension Father    Hyperlipidemia Father    Breast cancer Neg Hx      SOCIAL HISTORY: Social History   Socioeconomic History   Marital status: Married    Spouse name: Not on file   Number of children: 4   Years of education: Not on file   Highest education level: 9th grade  Occupational History   Not on file  Tobacco Use   Smoking status: Never   Smokeless tobacco: Never  Vaping Use   Vaping Use: Never used  Substance and Sexual Activity   Alcohol use: Yes    Comment: rare   Drug use: No   Sexual activity: Yes    Birth control/protection: Condom, I.U.D.  Other Topics Concern   Not on file  Social  History Narrative   Lives with husband and kids   Right handed   Caffeine: hardly any, maybe a cup per week   Social Determinants of Health   Financial Resource Strain: Not on file  Food Insecurity: No Food Insecurity (07/03/2021)   Hunger Vital Sign    Worried About Running Out of Food in the Last Year: Never true    Ran Out of Food in the Last Year: Never true  Transportation Needs: No Transportation Needs (07/03/2021)   PRAPARE - Hydrologist (Medical): No    Lack of Transportation (Non-Medical): No  Physical Activity: Not on file  Stress: Not on file  Social Connections: Not on file  Intimate Partner Violence: Not on file     PHYSICAL EXAM  There were no vitals filed for this visit. There is no height or weight on file to calculate BMI.  Generalized: Well developed, in no acute distress  Cardiology: normal rate and rhythm, no murmur auscultated  Respiratory: clear to auscultation bilaterally    Neurological examination  Mentation: Alert oriented to time, place, history taking. Follows all commands speech and language fluent Cranial nerve II-XII: Pupils were equal round reactive to light. Extraocular movements were full, visual field were full on confrontational test. Facial sensation and strength were normal. Uvula tongue midline. Head turning and shoulder shrug  were normal and symmetric. Motor: The motor testing reveals 5 over 5 strength of all 4 extremities. Good symmetric motor tone is noted throughout.  Sensory: Sensory testing is intact to soft touch on all 4 extremities. No evidence of extinction is noted.  Coordination: Cerebellar testing reveals good finger-nose-finger and heel-to-shin bilaterally.  Gait and station: Gait is normal. Tandem gait is normal. Romberg is negative. No drift is seen.  Reflexes: Deep tendon reflexes are symmetric and normal bilaterally.    DIAGNOSTIC DATA (LABS, IMAGING, TESTING) - I reviewed patient records,  labs, notes, testing and imaging myself where available.  Lab Results  Component Value Date   WBC 7.1 01/06/2021   HGB 13.9 01/06/2021   HCT 40.6 01/06/2021   MCV 90.8 01/06/2021   PLT 189 01/06/2021      Component Value Date/Time   NA 137 01/06/2021 2127   K 3.3 (L) 01/06/2021 2127   CL 105 01/06/2021 2127   CO2 23 01/06/2021 2127   GLUCOSE 108 (H) 01/06/2021 2127   BUN 10 01/06/2021 2127   CREATININE 0.55 01/06/2021 2127   CALCIUM 8.5 (L) 01/06/2021 2127   PROT 7.0 01/06/2021 2127   ALBUMIN 3.9  01/06/2021 2127   AST 22 01/06/2021 2127   ALT 15 01/06/2021 2127   ALKPHOS 69 01/06/2021 2127   BILITOT 0.2 (L) 01/06/2021 2127   GFRNONAA >60 01/06/2021 2127   GFRAA >60 05/04/2020 1109   No results found for: "CHOL", "HDL", "LDLCALC", "LDLDIRECT", "TRIG", "CHOLHDL" No results found for: "HGBA1C" Lab Results  Component Value Date   VITAMINB12 528 09/17/2022   Lab Results  Component Value Date   TSH 2.190 09/17/2022        No data to display               No data to display           ASSESSMENT AND PLAN  46 y.o. year old female  has a past medical history of Migraines. here with    No diagnosis found.  Gean Quint Paulita Cradle ***.  Healthy lifestyle habits encouraged. *** will follow up with PCP as directed. *** will return to see me in ***, sooner if needed. *** verbalizes understanding and agreement with this plan.   No orders of the defined types were placed in this encounter.    No orders of the defined types were placed in this encounter.    Shawnie Dapper, MSN, FNP-C 12/16/2022, 1:30 PM  [facility] [address], [address] [place], [place] [address] [phone]

## 2022-12-17 ENCOUNTER — Ambulatory Visit: Payer: Self-pay

## 2022-12-18 ENCOUNTER — Ambulatory Visit: Payer: Self-pay | Admitting: Family Medicine

## 2022-12-18 DIAGNOSIS — G43109 Migraine with aura, not intractable, without status migrainosus: Secondary | ICD-10-CM

## 2022-12-19 ENCOUNTER — Ambulatory Visit: Payer: Self-pay | Admitting: Hematology and Oncology

## 2022-12-19 ENCOUNTER — Ambulatory Visit
Admission: RE | Admit: 2022-12-19 | Discharge: 2022-12-19 | Disposition: A | Discharge status: Home / Self Care | Payer: Self-pay | Source: Ambulatory Visit | Attending: Obstetrics and Gynecology | Admitting: Obstetrics and Gynecology

## 2022-12-19 VITALS — BP 107/74 | Wt 185.0 lb

## 2022-12-19 DIAGNOSIS — Z1231 Encounter for screening mammogram for malignant neoplasm of breast: Secondary | ICD-10-CM

## 2022-12-19 NOTE — Progress Notes (Signed)
[name] is a 46 y.o. female who presents to [facility] clinic today with no complaints .    Pap Smear: Pap not smear completed today. Last Pap smear was 2022 and was normal. Per patient has no history of an abnormal Pap smear. Last Pap smear result is available in Epic.   Physical exam: Breasts Breasts symmetrical. No skin abnormalities bilateral breasts. No nipple retraction bilateral breasts. No nipple discharge bilateral breasts. No lymphadenopathy. No lumps palpated bilateral breasts.    MM DIAG BREAST TOMO UNI LEFT  Result Date: 08/06/2021 CLINICAL DATA:  Screening recall for a possible left breast asymmetry. EXAM: DIGITAL DIAGNOSTIC UNILATERAL LEFT MAMMOGRAM WITH TOMOSYNTHESIS AND CAD TECHNIQUE: Left digital diagnostic mammography and breast tomosynthesis was performed. The images were evaluated with computer-aided detection. COMPARISON:  Previous exam(s). ACR Breast Density Category b: There are scattered areas of fibroglandular density. FINDINGS: On the diagnostic spot-compression images, the possible asymmetry noted in the left breast on the current screening study disperses consistent with normal fibroglandular tissue. There is no underlying mass, architectural distortion or significant residual asymmetry. There are no suspicious calcifications. IMPRESSION: No evidence of breast malignancy. RECOMMENDATION: Screening mammogram in one year.(Code:SM-B-01Y) I have discussed the findings and recommendations with the patient. If applicable, a reminder letter will be sent to the patient regarding the next appointment. BI-RADS CATEGORY  1: Negative. Electronically Signed   By: Lajean Manes M.D.   On: 08/06/2021 15:52   MS DIGITAL SCREENING TOMO BILATERAL  Result Date: 07/10/2021 CLINICAL DATA:  Screening. EXAM: DIGITAL SCREENING BILATERAL MAMMOGRAM WITH TOMOSYNTHESIS AND CAD TECHNIQUE: Bilateral screening digital craniocaudal and mediolateral oblique mammograms were obtained.  Bilateral screening digital breast tomosynthesis was performed. The images were evaluated with computer-aided detection. COMPARISON:  Previous exam(s). ACR Breast Density Category b: There are scattered areas of fibroglandular density. FINDINGS: In the left breast, a possible asymmetry warrants further evaluation. In the right breast, no findings suspicious for malignancy. IMPRESSION: Further evaluation is suggested for possible asymmetry in the left breast. RECOMMENDATION: Diagnostic mammogram and possibly ultrasound of the left breast. (Code:FI-L-70M) The patient will be contacted regarding the findings, and additional imaging will be scheduled. BI-RADS CATEGORY  0: Incomplete. Need additional imaging evaluation and/or prior mammograms for comparison. Electronically Signed   By: Lovey Newcomer M.D.   On: 07/10/2021 13:30   MS DIGITAL SCREENING TOMO BILATERAL  Result Date: 04/06/2020 CLINICAL DATA:  Screening. EXAM: DIGITAL SCREENING BILATERAL MAMMOGRAM WITH TOMO AND CAD COMPARISON:  Previous exam(s). ACR Breast Density Category b: There are scattered areas of fibroglandular density. FINDINGS: There are no findings suspicious for malignancy. Images were processed with CAD. IMPRESSION: No mammographic evidence of malignancy. A result letter of this screening mammogram will be mailed directly to the patient. RECOMMENDATION: Screening mammogram in one year. (Code:SM-B-01Y) BI-RADS CATEGORY  1: Negative. Electronically Signed   By: Fidela Salisbury M.D.   On: 04/06/2020 12:43   MS DIGITAL SCREENING TOMO BILATERAL  Result Date: 02/11/2019 CLINICAL DATA:  Screening. EXAM: DIGITAL SCREENING BILATERAL MAMMOGRAM WITH TOMO AND CAD COMPARISON:  Previous exam(s). ACR Breast Density Category b: There are scattered areas of fibroglandular density. FINDINGS: There are no findings suspicious for malignancy. Images were processed with CAD. IMPRESSION: No mammographic evidence of malignancy. A result letter of this screening  mammogram will be mailed directly to the patient. RECOMMENDATION: Screening mammogram in one year. (Code:SM-B-01Y) BI-RADS CATEGORY  1: Negative. Electronically Signed   By: Margarette Canada M.D.   On: 02/11/2019 16:30  Pelvic/Bimanual Pap is not indicated today    Smoking History: Patient has never smoked and was not referred to quit line.    Patient Navigation: Patient education provided. Access to services provided for patient through B[facility]CP program. Natale Lay interpreter provided. No transportation provided   Colorectal Cancer Screening: Per patient has never had colonoscopy completed No complaints today.    Breast and Cervical Cancer Risk Assessment: Patient does not have family history of breast cancer, known genetic mutations, or radiation treatment to the chest before age 64. Patient does not have history of cervical dysplasia, immunocompromised, or DES exposure in-utero.  Risk Scores as of 12/19/2022     [name]           5-year 0.75 %   Lifetime 9.53 %   This patient is [place] but has no documented birth country, so the [place] model used data from [place] patients to calculate their risk score. Document a birth country in the Demographics activity for a more accurate score.         Last calculated by Caprice Red, CMA on 12/19/2022 at 10:54 AM        A: B[facility]CP exam without pap smear No complaints with benign exam.   P: Referred patient to the [facility] for a screening mammogram. Appointment scheduled 12/19/22.  Ilda Basset A, NP 12/19/2022 11:05 AM

## 2022-12-19 NOTE — Patient Instructions (Signed)
Taught [name] Derrell Lolling about self breast awareness and gave educational materials to take home. Patient did not need a Pap smear today due to last Pap smear was in 2022 per patient. Let her know [facility]CP will cover Pap smears every 5 years unless has a history of abnormal Pap smears. Referred patient to the [facility] for screening mammogram. Appointment scheduled for 12/19/22. Patient aware of appointment and will be there. Let patient know will follow up with her within the next couple weeks with results. [name] Derrell Lolling verbalized understanding.  Pascal Lux, NP 11:08 AM

## 2023-01-24 ENCOUNTER — Other Ambulatory Visit (HOSPITAL_COMMUNITY): Payer: Self-pay

## 2023-01-24 MED ORDER — TERBINAFINE HCL 250 MG PO TABS
250.0000 mg | ORAL_TABLET | Freq: Every day | ORAL | 0 refills | Status: DC
  Filled 2023-01-24: qty 60, 60d supply, fill #0

## 2023-01-24 MED ORDER — METHOCARBAMOL 750 MG PO TABS
750.0000 mg | ORAL_TABLET | Freq: Three times a day (TID) | ORAL | 0 refills | Status: DC | PRN
  Filled 2023-01-24: qty 270, 90d supply, fill #0

## 2023-01-24 MED ORDER — FLUTICASONE-SALMETEROL 250-50 MCG/ACT IN AEPB
1.0000 | INHALATION_SPRAY | Freq: Two times a day (BID) | RESPIRATORY_TRACT | 7 refills | Status: DC
  Filled 2023-01-24: qty 60, 30d supply, fill #0

## 2023-01-24 MED ORDER — CICLOPIROX 8 % EX SOLN
CUTANEOUS | 3 refills | Status: DC
  Filled 2023-01-24: qty 6.6, 30d supply, fill #0

## 2023-01-30 ENCOUNTER — Other Ambulatory Visit (HOSPITAL_COMMUNITY): Payer: Self-pay

## 2023-02-05 ENCOUNTER — Telehealth: Payer: Self-pay | Admitting: Psychiatry

## 2023-02-05 ENCOUNTER — Telehealth: Payer: Self-pay

## 2023-02-05 NOTE — Telephone Encounter (Signed)
Pt's daughter reports that pt woke up early and has blood in her eyes.  Daughter states pt informed her that within less than a year this has occurred 3-4 times.  Daughter states pt head is hurting very bad.  She would like to come in earlier than current scheduled appointment.Please call

## 2023-02-05 NOTE — Telephone Encounter (Signed)
Called pt's daughter and she informed me that her mom had a bad headache. Pt's daughter said that he mom busted a blood vessel in her right eye. Pt's daughter said that has had this happen last year at least 3 times. Pt's daughter said that her mom's eye doesn't hurt. I told the daughter that I would send her message to Dr. Billey Gosling so that she will be able to address this issue. Pt verbalized understanding.

## 2023-02-05 NOTE — Telephone Encounter (Signed)
Daughter said, someone from the office called, but could answer the call. Would like a call back please.

## 2023-02-05 NOTE — Telephone Encounter (Signed)
Late entry Called pt 15 mins after initial call and left a voice message for pt to call back.

## 2023-02-17 ENCOUNTER — Other Ambulatory Visit (HOSPITAL_COMMUNITY): Payer: Self-pay

## 2023-02-17 MED ORDER — DEXTROMETHORPHAN-GUAIFENESIN 10-100 MG/5ML PO SYRP: ORAL_SOLUTION | ORAL | 0 refills | Status: DC

## 2023-02-17 MED ORDER — AZITHROMYCIN 500 MG PO TABS
500.0000 mg | ORAL_TABLET | Freq: Every day | ORAL | 0 refills | Status: DC
  Filled 2023-02-17: qty 3, 3d supply, fill #0

## 2023-02-17 MED ORDER — ALBUTEROL SULFATE HFA 108 (90 BASE) MCG/ACT IN AERS
2.0000 | INHALATION_SPRAY | RESPIRATORY_TRACT | 11 refills | Status: DC
  Filled 2023-02-17: qty 6.7, 16d supply, fill #0

## 2023-02-17 MED ORDER — BUDESONIDE-FORMOTEROL FUMARATE 160-4.5 MCG/ACT IN AERO: 2.0000 | INHALATION_SPRAY | Freq: Four times a day (QID) | RESPIRATORY_TRACT | 3 refills | Status: DC

## 2023-02-17 MED ORDER — PREDNISONE 20 MG PO TABS
40.0000 mg | ORAL_TABLET | Freq: Every morning | ORAL | 0 refills | Status: DC
  Filled 2023-02-17: qty 10, 5d supply, fill #0

## 2023-02-18 ENCOUNTER — Other Ambulatory Visit: Payer: Self-pay

## 2023-02-18 ENCOUNTER — Emergency Department (HOSPITAL_BASED_OUTPATIENT_CLINIC_OR_DEPARTMENT_OTHER): Payer: Self-pay

## 2023-02-18 ENCOUNTER — Encounter (HOSPITAL_BASED_OUTPATIENT_CLINIC_OR_DEPARTMENT_OTHER): Payer: Self-pay | Admitting: Emergency Medicine

## 2023-02-18 ENCOUNTER — Emergency Department (HOSPITAL_BASED_OUTPATIENT_CLINIC_OR_DEPARTMENT_OTHER)
Admission: EM | Admit: 2023-02-18 | Discharge: 2023-02-18 | Disposition: A | Discharge status: Home / Self Care | Payer: Self-pay | Attending: Emergency Medicine | Admitting: Emergency Medicine

## 2023-02-18 ENCOUNTER — Other Ambulatory Visit (HOSPITAL_COMMUNITY): Payer: Self-pay

## 2023-02-18 DIAGNOSIS — R059 Cough, unspecified: Secondary | ICD-10-CM | POA: Insufficient documentation

## 2023-02-18 DIAGNOSIS — R0989 Other specified symptoms and signs involving the circulatory and respiratory systems: Secondary | ICD-10-CM | POA: Insufficient documentation

## 2023-02-18 DIAGNOSIS — J209 Acute bronchitis, unspecified: Secondary | ICD-10-CM

## 2023-02-18 DIAGNOSIS — J029 Acute pharyngitis, unspecified: Secondary | ICD-10-CM | POA: Insufficient documentation

## 2023-02-18 DIAGNOSIS — M549 Dorsalgia, unspecified: Secondary | ICD-10-CM | POA: Insufficient documentation

## 2023-02-18 HISTORY — DX: Unspecified asthma, uncomplicated: J45.909

## 2023-02-18 MED ORDER — AZITHROMYCIN 250 MG PO TABS
500.0000 mg | ORAL_TABLET | Freq: Once | ORAL | Status: AC
  Administered 2023-02-18: 500 mg via ORAL
  Filled 2023-02-18: qty 2

## 2023-02-18 MED ORDER — AZITHROMYCIN 250 MG PO TABS: 250.0000 mg | ORAL_TABLET | Freq: Every day | ORAL | 0 refills | Status: DC

## 2023-02-18 NOTE — ED Triage Notes (Addendum)
Pt is c/o pain in her throat and her back and is c/o shortness of breath  Sxs started on Friday  Family states it started as an allergy and has gotten worse   Denies fever  Pt has had a productive cough with yellow, grey, green sputum with some blood mixed in

## 2023-02-18 NOTE — Discharge Instructions (Signed)
Begin taking Zithromax as prescribed.  Take over-the-counter medications as needed for symptom relief.  Take Tylenol 1000 mg rotated with ibuprofen 600 mg every 4 hours as needed for pain or fever.

## 2023-02-18 NOTE — ED Provider Notes (Signed)
[place] [facility] Provider Note   CSN: [id number] Arrival date & time: 02/18/23  0032     History  Chief Complaint  Patient presents with   Back Pain    [name] is a 47 y.o. female.  Patient is a 47 year old female presenting with complaints of chest congestion, cough that is productive of blood-tinged sputum.  This has been worsening over the past 4 days.  There have been several family members ill in a similar fashion.  There are no aggravating or alleviating factors.  The history is provided by the patient.       Home Medications Prior to Admission medications   Medication Sig Start Date End Date Taking? Authorizing Provider  albuterol (VENTOLIN HFA) 108 (90 Base) MCG/ACT inhaler Inhale into the lungs every 6 (six) hours as needed for wheezing or shortness of breath.    [provider]  albuterol (VENTOLIN HFA) 108 (90 Base) MCG/ACT inhaler Inhale 2 puffs into the lungs every 4 hours. 02/17/23     amitriptyline (ELAVIL) 10 MG tablet Take one pill at bedtime Patient not taking: Reported on 12/19/2022 03/15/22   Shelby Harold, MD  azithromycin (ZITHROMAX) 500 MG tablet Take 1 tablet by mouth daily for 3 days 02/17/23     budesonide-formoterol (SYMBICORT) 160-4.5 MCG/ACT inhaler Inhale 2 puffs into the lungs 4 (four) times daily. 02/17/23     ciclopirox (PENLAC) 8 % solution Apply to the affected areas daily preferably at bedtime or 8 hours before washing.  Remove once a week with rubbing alcohol. 01/24/23     Dextromethorphan-guaiFENesin (CHEST CONGESTION RELIEF DM) 10-100 MG/5ML liquid Take 10 mL every 4 hours by oral route. 02/17/23     diclofenac (CATAFLAM) 50 MG tablet Take 1-2 pills as needed for migraine headache Patient not taking: Reported on 12/19/2022 03/21/22   Shelby Harold, MD  fluticasone (FLOVENT HFA) 110 MCG/ACT inhaler Inhale 1 puff into the lungs 2 (two) times daily.    [provider]   fluticasone-salmeterol (ADVAIR) 250-50 MCG/ACT AEPB Inhale 1 puff into the lungs 2 (two) times daily. 01/24/23     methocarbamol (ROBAXIN) 750 MG tablet Take 1 tablet (750 mg total) by mouth 3 (three) times daily as needed. 01/24/23     methocarbamol (ROBAXIN-750) 750 MG tablet Take 1 tablet (750 mg total) by mouth every 8 (eight) hours as needed for muscle spasms. 09/17/22   Shelby Harold, MD  montelukast (SINGULAIR) 10 MG tablet Take 1 tablet every day by oral route in the morning for 90 days. 09/24/22   [provider]  naproxen (NAPROSYN) 500 MG tablet naproxen 500 mg tablet  Take 1 tablet twice a day by oral route with meals for 30 days.    [provider]  predniSONE (DELTASONE) 20 MG tablet Take 2 tablets (40 mg) by mouth every morning for 5 days 02/17/23     terbinafine (LAMISIL) 250 MG tablet Take 1 tablet every day by oral route with meals for 60 days. 09/24/22   [provider]  terbinafine (LAMISIL) 250 MG tablet Take 1 tablet (250 mg total) by mouth daily with meals 01/24/23     topiramate (TOPAMAX) 25 MG tablet Start Topamax for headache prevention. Take 25 mg (1 pill) at bedtime for one week, then increase to 50 mg (2 pills) at bedtime for one week, then take 75 mg (3 pills) at bedtime for one week, then take 100 mg (4 pills) at bedtime and continue  at that dose 09/17/22   Shelby Harold, MD      Allergies    Hydrocodone    Review of Systems   Review of Systems  All other systems reviewed and are negative.   Physical Exam Updated Vital Signs BP 107/74 (BP Location: Left Arm)   Pulse (!) 103   Temp 98.7 F (37.1 C) (Oral)   Resp 20   Ht 5' 1"$  (1.549 m)   Wt 90.7 kg   SpO2 96%   BMI 37.79 kg/m  Physical Exam Vitals and nursing note reviewed.  Constitutional:      General: She is not in acute distress.    Appearance: She is well-developed. She is not diaphoretic.  HENT:     Head: Normocephalic and atraumatic.  Cardiovascular:     Rate and  Rhythm: Normal rate and regular rhythm.     Heart sounds: No murmur heard.    No friction rub. No gallop.  Pulmonary:     Effort: Pulmonary effort is normal. No respiratory distress.     Breath sounds: Normal breath sounds. No wheezing.  Abdominal:     General: Bowel sounds are normal. There is no distension.     Palpations: Abdomen is soft.     Tenderness: There is no abdominal tenderness.  Musculoskeletal:        General: Normal range of motion.     Cervical back: Normal range of motion and neck supple.  Skin:    General: Skin is warm and dry.  Neurological:     General: No focal deficit present.     Mental Status: She is alert and oriented to person, place, and time.     ED Results / Procedures / Treatments   Labs (all labs ordered are listed, but only abnormal results are displayed) Labs Reviewed - No data to display  EKG None  Radiology DG Chest 2 View  Result Date: 02/18/2023 CLINICAL DATA:  Cough EXAM: CHEST - 2 VIEW COMPARISON:  None Available. FINDINGS: The heart size and mediastinal contours are within normal limits. Both lungs are clear. The visualized skeletal structures are unremarkable. IMPRESSION: No active cardiopulmonary disease. Electronically Signed   By: Ulyses Jarred M.D.   On: 02/18/2023 01:09    Procedures Procedures    Medications Ordered in ED Medications  azithromycin (ZITHROMAX) tablet 500 mg (has no administration in time range)    ED Course/ Medical Decision Making/ A&P  Patient presenting with URI symptoms as described in the HPI.  Chest x-ray shows no evidence for pneumonia, but due to the fact she is coughing up blood-tinged sputum, I will prescribe an antibiotic and see if this helps.  Patient to take over-the-counter medications and return as needed.  Final Clinical Impression(s) / ED Diagnoses Final diagnoses:  None    Rx / DC Orders ED Discharge Orders     None         Veryl Speak, MD 02/18/23 [phone]

## 2023-02-20 ENCOUNTER — Other Ambulatory Visit (HOSPITAL_COMMUNITY): Payer: Self-pay

## 2023-03-04 ENCOUNTER — Ambulatory Visit: Payer: Self-pay | Admitting: Family Medicine

## 2023-03-04 ENCOUNTER — Other Ambulatory Visit (HOSPITAL_COMMUNITY): Payer: Self-pay

## 2023-03-04 MED ORDER — MONTELUKAST SODIUM 10 MG PO TABS
10.0000 mg | ORAL_TABLET | Freq: Every morning | ORAL | 3 refills | Status: DC
  Filled 2023-03-04: qty 90, 90d supply, fill #0

## 2023-03-04 MED ORDER — DEXTROMETHORPHAN-GUAIFENESIN 10-100 MG/5ML PO SYRP: 10.0000 mL | ORAL_SOLUTION | ORAL | 0 refills | Status: DC | PRN

## 2023-03-11 ENCOUNTER — Other Ambulatory Visit (HOSPITAL_COMMUNITY): Payer: Self-pay

## 2023-06-16 ENCOUNTER — Other Ambulatory Visit (HOSPITAL_COMMUNITY): Payer: Self-pay

## 2023-06-16 MED ORDER — PREDNISONE 10 MG PO TABS
ORAL_TABLET | ORAL | 0 refills | Status: AC
  Filled 2023-06-16: qty 21, 6d supply, fill #0

## 2023-06-16 MED ORDER — DICLOFENAC SODIUM 50 MG PO TBEC
50.0000 mg | DELAYED_RELEASE_TABLET | Freq: Two times a day (BID) | ORAL | 1 refills | Status: DC
  Filled 2023-06-16: qty 60, 30d supply, fill #0

## 2023-06-16 MED ORDER — DICLOFENAC SODIUM 1 % EX GEL
Freq: Four times a day (QID) | CUTANEOUS | 1 refills | Status: DC
  Filled 2023-06-16: qty 200, 15d supply, fill #0

## 2023-06-19 ENCOUNTER — Other Ambulatory Visit (HOSPITAL_COMMUNITY): Payer: Self-pay

## 2023-07-08 ENCOUNTER — Other Ambulatory Visit (HOSPITAL_COMMUNITY): Payer: Self-pay

## 2023-07-08 MED ORDER — METFORMIN HCL ER 500 MG PO TB24
500.0000 mg | ORAL_TABLET | Freq: Every day | ORAL | 1 refills | Status: AC
  Filled 2023-07-08: qty 90, 90d supply, fill #0

## 2023-07-16 ENCOUNTER — Other Ambulatory Visit (HOSPITAL_COMMUNITY): Payer: Self-pay

## 2023-08-13 ENCOUNTER — Other Ambulatory Visit (HOSPITAL_COMMUNITY): Payer: Self-pay

## 2023-08-13 MED ORDER — MONTELUKAST SODIUM 10 MG PO TABS
10.0000 mg | ORAL_TABLET | Freq: Every morning | ORAL | 3 refills | Status: DC
  Filled 2023-08-13: qty 90, 90d supply, fill #0
  Filled 2024-08-11: qty 90, 90d supply, fill #1

## 2023-08-13 MED ORDER — BENZONATATE 200 MG PO CAPS
200.0000 mg | ORAL_CAPSULE | Freq: Three times a day (TID) | ORAL | 0 refills | Status: DC | PRN
  Filled 2023-08-13: qty 30, 10d supply, fill #0

## 2023-08-13 MED ORDER — ALBUTEROL SULFATE HFA 108 (90 BASE) MCG/ACT IN AERS
2.0000 | INHALATION_SPRAY | RESPIRATORY_TRACT | 11 refills | Status: DC
  Filled 2023-08-13: qty 6.7, 16d supply, fill #0

## 2023-08-13 MED ORDER — PREDNISONE 5 MG (21) PO TBPK
ORAL_TABLET | ORAL | 0 refills | Status: DC
  Filled 2023-08-13: qty 21, 6d supply, fill #0

## 2023-08-13 MED ORDER — FLUTICASONE PROPIONATE 50 MCG/ACT NA SUSP
1.0000 | Freq: Every day | NASAL | 0 refills | Status: DC
  Filled 2023-08-13: qty 16, 60d supply, fill #0

## 2023-08-13 MED ORDER — AZITHROMYCIN 250 MG PO TABS
ORAL_TABLET | ORAL | 0 refills | Status: DC
  Filled 2023-08-13: qty 6, 5d supply, fill #0

## 2023-09-02 ENCOUNTER — Other Ambulatory Visit (HOSPITAL_COMMUNITY): Payer: Self-pay

## 2023-09-02 MED ORDER — MONTELUKAST SODIUM 10 MG PO TABS
10.0000 mg | ORAL_TABLET | Freq: Every day | ORAL | 3 refills | Status: DC
  Filled 2023-09-02: qty 30, 30d supply, fill #0

## 2023-09-03 ENCOUNTER — Other Ambulatory Visit (HOSPITAL_COMMUNITY): Payer: Self-pay

## 2023-09-04 ENCOUNTER — Ambulatory Visit: Payer: Self-pay | Admitting: Family Medicine

## 2023-09-12 ENCOUNTER — Other Ambulatory Visit (HOSPITAL_COMMUNITY): Payer: Self-pay

## 2023-09-24 ENCOUNTER — Other Ambulatory Visit (HOSPITAL_COMMUNITY): Payer: Self-pay

## 2023-09-30 ENCOUNTER — Other Ambulatory Visit (HOSPITAL_COMMUNITY): Payer: Self-pay

## 2023-10-01 ENCOUNTER — Other Ambulatory Visit (HOSPITAL_COMMUNITY): Payer: Self-pay

## 2023-10-01 MED ORDER — PREDNISONE 20 MG PO TABS
40.0000 mg | ORAL_TABLET | Freq: Every morning | ORAL | 0 refills | Status: DC
  Filled 2023-10-01: qty 10, 5d supply, fill #0

## 2023-10-01 MED ORDER — DICLOFENAC SODIUM 1 % EX GEL
CUTANEOUS | 1 refills | Status: DC
  Filled 2023-10-01: qty 200, 25d supply, fill #0

## 2023-10-13 ENCOUNTER — Other Ambulatory Visit (HOSPITAL_COMMUNITY): Payer: Self-pay

## 2023-10-15 ENCOUNTER — Other Ambulatory Visit (HOSPITAL_COMMUNITY): Payer: Self-pay

## 2023-12-09 ENCOUNTER — Other Ambulatory Visit (HOSPITAL_COMMUNITY): Payer: Self-pay

## 2023-12-09 MED ORDER — FLUTICASONE PROPIONATE 50 MCG/ACT NA SUSP
1.0000 | Freq: Every day | NASAL | 0 refills | Status: AC
  Filled 2023-12-09 – 2024-09-30 (×2): qty 16, 60d supply, fill #0

## 2023-12-09 MED ORDER — GUAIFENESIN-DM 100-10 MG/5ML PO SYRP: ORAL_SOLUTION | ORAL | 0 refills | Status: DC

## 2023-12-09 MED ORDER — AZITHROMYCIN 500 MG PO TABS
500.0000 mg | ORAL_TABLET | Freq: Every day | ORAL | 0 refills | Status: AC
  Filled 2023-12-09: qty 3, 3d supply, fill #0

## 2023-12-09 MED ORDER — VICKS SINEX DAYQUIL/NYQUIL PO MISC: ORAL | 0 refills | Status: DC

## 2023-12-09 MED ORDER — CEPACOL SORE THROAT & COUGH 5-7.5 MG MT LOZG: LOZENGE | OROMUCOSAL | 0 refills | Status: DC

## 2023-12-19 ENCOUNTER — Other Ambulatory Visit (HOSPITAL_COMMUNITY): Payer: Self-pay

## 2023-12-26 ENCOUNTER — Other Ambulatory Visit (HOSPITAL_COMMUNITY): Payer: Self-pay

## 2023-12-26 MED ORDER — AZITHROMYCIN 500 MG PO TABS
500.0000 mg | ORAL_TABLET | Freq: Every day | ORAL | 0 refills | Status: AC
  Filled 2023-12-26: qty 3, 3d supply, fill #0

## 2024-02-02 ENCOUNTER — Institutional Professional Consult (permissible substitution): Payer: Self-pay | Admitting: Pulmonary Disease

## 2024-03-24 NOTE — Progress Notes (Signed)
 Chief Complaint  Patient presents with   Follow-up    Pt in room 1.Interpreter in room [name]. Here for migraine follow up. Pt reports migraines are better, still having twitching in right eye. Pt reports migraines about once per month. Has right side hand numbness. Pt states her right outer foot is numb at times. Pt would like Rx's printed and not sent to pharmacy.    HISTORY OF PRESENT ILLNESS:  03/29/24 ALL:  [name] is a 48 y.o. female here today for follow up for  headaches and memory loss. She was seen in consult with Dr Delena Bali 02/2022 and started on amitriptyline. Nurtec sample given but unable to afford rx. Diclofenac used for abortive therapy. MRI normal. She returned 08/2022 reporting minimal improvement on amitriptyline and brain fog. She was switched to topiramate and methocarbamol. TSH and B12 normal.   Since, she reports doing well. Headaches have significantly improved. She stopped topiramate about a year ago as headaches had improved. Unclear if she ever took methocarbamol. She may have 1 headache day per month. No obvious migrainous features. Headache easily aborted with Tylenol.   She has had intermittent symptoms of eye twitches and numbness of right thumb, left foot numbness. She has not discussed with PCP. She works in Theatre stage manager.   [name] assist with interpreting.   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    At her last visit, brain MRI was ordered. Nurtec sample was provided. Referral to neck PT was placed.   Interval History: Headaches initially improved after her last visit, but they started to return 2 months ago. She has 1-3 headaches per week. She has also had a lot of tightness in her neck and jaw. She was not able to afford Nurtec, so she was prescribed diclofenac for rescue. This helped at first, but has been less effective  recently. Amitriptyline has been making her drowsy in the morning.   She also reports brain fog and short term memory difficulty which has worsened since her last visit.   Brain MRI 03/25/22 was unremarkable.   Headache days per month: 12 Headache free days per month: 18   Current Headache Regimen: Preventative: none Abortive: diclofenac 50-100 mg PRN     Prior Therapies                                  Imitrex 25 mg PRN Diclofenac 50-100 mg PRN Naproxen 500 mg PRN Elavil 10 mg daily - drowsiness   REVIEW OF SYSTEMS: Out of a complete 14 system review of symptoms, the patient complains only of the following symptoms, headaches, paresthesias, right thumb pain, eye twitching and all other reviewed systems are negative.   ALLERGIES: Allergies  Allergen Reactions   Hydrocodone Rash     HOME MEDICATIONS: Outpatient Medications Prior to Visit  Medication Sig Dispense Refill   albuterol (VENTOLIN HFA) 108 (90 Base) MCG/ACT inhaler Inhale into the lungs every 6 (six) hours as needed for wheezing or shortness of breath. (Patient not taking: Reported on 03/29/2024)     azithromycin (ZITHROMAX) 250 MG tablet Take 1 tablet (250 mg total) by mouth daily. (Patient not taking: Reported on 03/29/2024) 4 tablet 0   budesonide-formoterol (SYMBICORT) 160-4.5 MCG/ACT inhaler Inhale 2 puffs into the lungs 4 (four) times daily as directed (Patient  not taking: Reported on 03/29/2024) 10.2 g 3   ciclopirox (PENLAC) 8 % solution Apply to the affected areas daily preferably at bedtime or 8 hours before washing.  Remove once a week with rubbing alcohol. (Patient not taking: Reported on 03/29/2024) 6.6 mL 3   Dextromethorphan-Benzocaine (CEPACOL SORE THROAT & COUGH) 5-7.5 MG LOZG Take 1 lozenge 5 times a day by oral route. (Patient not taking: Reported on 03/29/2024) 16 lozenge 0   Dextromethorphan-guaiFENesin (CHEST CONGESTION RELIEF DM) 10-100 MG/5ML liquid Take 10 mL every 4 hours by oral route. (Patient not  taking: Reported on 03/29/2024) 473 mL 0   diclofenac (CATAFLAM) 50 MG tablet Take 1-2 pills as needed for migraine headache (Patient not taking: Reported on 03/29/2024) 30 tablet 3   diclofenac Sodium (VOLTAREN) 1 % GEL APPLY 2 GRAMS TO THE AFFECTED AREA(S) BY TOPICAL ROUTE 4 TIMES PER DAY (Patient not taking: Reported on 03/29/2024) 200 g 1   Doxylamine-Phenylephrine-APAP (VICKS SINEX DAYQUIL/NYQUIL) MISC take as instructed in box (Patient not taking: Reported on 03/29/2024) 48 each 0   fluticasone (FLONASE) 50 MCG/ACT nasal spray Place 1 spray into both nostrils daily. (Patient not taking: Reported on 03/29/2024) 16 g 0   fluticasone (FLOVENT HFA) 110 MCG/ACT inhaler Inhale 1 puff into the lungs 2 (two) times daily. (Patient not taking: Reported on 03/29/2024)     fluticasone-salmeterol (ADVAIR) 250-50 MCG/ACT AEPB Inhale 1 puff into the lungs 2 (two) times daily. (Patient not taking: Reported on 03/29/2024) 120 each 7   metFORMIN (GLUCOPHAGE-XR) 500 MG 24 hr tablet Take 1 tablet (500 mg total) by mouth daily with a meal. (Patient not taking: Reported on 03/29/2024) 90 tablet 1   montelukast (SINGULAIR) 10 MG tablet Take 1 tablet every day by oral route in the morning for 90 days. (Patient not taking: Reported on 03/29/2024)     montelukast (SINGULAIR) 10 MG tablet Take 1 tablet (10 mg total) by mouth every morning. (Patient not taking: Reported on 03/29/2024) 90 tablet 3   montelukast (SINGULAIR) 10 MG tablet Take 1 tablet (10 mg total) by mouth daily. (Patient not taking: Reported on 03/29/2024) 30 tablet 3   naproxen (NAPROSYN) 500 MG tablet naproxen 500 mg tablet  Take 1 tablet twice a day by oral route with meals for 30 days. (Patient not taking: Reported on 03/29/2024)     terbinafine (LAMISIL) 250 MG tablet Take 1 tablet every day by oral route with meals for 60 days. (Patient not taking: Reported on 03/29/2024)     albuterol (VENTOLIN HFA) 108 (90 Base) MCG/ACT inhaler Inhale 2 puffs into the lungs every  4 hours. (Patient not taking: Reported on 03/29/2024) 6.7 g 11   albuterol (VENTOLIN HFA) 108 (90 Base) MCG/ACT inhaler Inhale 2 puffs into the lungs every 4 hours as needed for shortness of breath or wheezing (Patient not taking: Reported on 03/29/2024) 6.7 g 11   amitriptyline (ELAVIL) 10 MG tablet Take one pill at bedtime (Patient not taking: Reported on 03/29/2024) 30 tablet 3   diclofenac Sodium (VOLTAREN) 1 % GEL Apply 2 grams to affected areas 4 times a day (Patient not taking: Reported on 03/29/2024) 200 g 1   fluticasone (FLONASE) 50 MCG/ACT nasal spray Place 1 spray into both nostrils daily. (Patient not taking: Reported on 03/29/2024) 16 g 0   guaiFENesin-dextromethorphan (ROBITUSSIN DM) 100-10 MG/5ML syrup Take 10 mL every 4 hours by oral route. (Patient not taking: Reported on 03/29/2024) 237 mL 0   methocarbamol (ROBAXIN-750) 750 MG tablet Take  1 tablet (750 mg total) by mouth every 8 (eight) hours as needed for muscle spasms. (Patient not taking: Reported on 03/29/2024) 30 tablet 6   predniSONE (DELTASONE) 20 MG tablet Take 2 tablets (40 mg) by mouth every morning for 5 days (Patient not taking: Reported on 03/29/2024) 10 tablet 0   predniSONE (DELTASONE) 20 MG tablet Take 2 tablets (40 mg total) by mouth every morning for 5 days. (Patient not taking: Reported on 03/29/2024) 10 tablet 0   predniSONE (STERAPRED UNI-PAK 21 TAB) 5 MG (21) TBPK tablet Follow package directions (Patient not taking: Reported on 03/29/2024) 21 tablet 0   topiramate (TOPAMAX) 25 MG tablet Start Topamax for headache prevention. Take 25 mg (1 pill) at bedtime for one week, then increase to 50 mg (2 pills) at bedtime for one week, then take 75 mg (3 pills) at bedtime for one week, then take 100 mg (4 pills) at bedtime and continue at that dose (Patient not taking: Reported on 03/29/2024) 120 tablet 6   No facility-administered medications prior to visit.     PAST MEDICAL HISTORY: Past Medical History:  Diagnosis Date    Asthma    Migraines      PAST SURGICAL HISTORY: Past Surgical History:  Procedure Laterality Date   CHOLECYSTECTOMY       FAMILY HISTORY: Family History  Problem Relation Age of Onset   Migraines Mother    Hyperlipidemia Mother    Hypertension Mother    Diabetes Father    Hypertension Father    Hyperlipidemia Father    Breast cancer Neg Hx      SOCIAL HISTORY: Social History   Socioeconomic History   Marital status: Married    Spouse name: Not on file   Number of children: 4   Years of education: Not on file   Highest education level: 9th grade  Occupational History   Not on file  Tobacco Use   Smoking status: Never   Smokeless tobacco: Never  Vaping Use   Vaping status: Never Used  Substance and Sexual Activity   Alcohol use: Not Currently    Comment: rare   Drug use: No   Sexual activity: Yes    Birth control/protection: Condom, I.U.D.  Other Topics Concern   Not on file  Social History Narrative   Lives with husband and kids   Right handed   Caffeine: hardly any, maybe a cup per week   Social Drivers of Health   Financial Resource Strain: Not at Risk (07/04/2023)   Received from [employer]    Financial Resource Strain: 1  Food Insecurity: Not at Risk (07/04/2023)   Received from [facility] Insecurity    Food: 1  Transportation Needs: Not at Risk (07/04/2023)   Received from [facility] Needs    Transportation: 1  Physical Activity: Not on File (04/18/2022)   Received from [facility]   Physical Activity    Physical Activity: 0  Stress: Not on File (04/18/2022)   Received from [facility]   Stress    Stress: 0  Social Connections: Not on File (09/02/2023)   Received from [facility]   Social Connections    Connectedness: 0  Intimate Partner Violence: Unknown (04/05/2022)   [facility]   HITS    Physically Hurt: Not on file    Insult or Talk Down To: Not on file    Threaten Physical Harm: Not on  file  Scream or Curse: Not on file     PHYSICAL EXAM  Vitals:   03/29/24 1053  BP: 115/75  Pulse: 80  Weight: 209 lb 8 oz (95 kg)  Height: 5\' 4"  (1.626 m)   Body mass index is 35.96 kg/m.  Generalized: Well developed, in no acute distress  Cardiology: normal rate and rhythm, no murmur auscultated  Respiratory: clear to auscultation bilaterally    Neurological examination  Mentation: Alert oriented to time, place, history taking. Follows all commands speech and language fluent Cranial nerve II-XII: Pupils were equal round reactive to light. Extraocular movements were full, visual field were full on confrontational test. Facial sensation and strength were normal. Uvula tongue midline. Head turning and shoulder shrug  were normal and symmetric. Motor: The motor testing reveals 5 over 5 strength of all 4 extremities. Good symmetric motor tone is noted throughout.  Sensory: Sensory testing is intact to soft touch on all 4 extremities. No evidence of extinction is noted. Positive phalen and tinel  Coordination: Cerebellar testing reveals good finger-nose-finger and heel-to-shin bilaterally.  Gait and station: Gait is normal. T   DIAGNOSTIC DATA (LABS, IMAGING, TESTING) - I reviewed patient records, labs, notes, testing and imaging myself where available.  Lab Results  Component Value Date   WBC 7.1 01/06/2021   HGB 13.9 01/06/2021   HCT 40.6 01/06/2021   MCV 90.8 01/06/2021   PLT 189 01/06/2021      Component Value Date/Time   NA 137 01/06/2021 2127   K 3.3 (L) 01/06/2021 2127   CL 105 01/06/2021 2127   CO2 23 01/06/2021 2127   GLUCOSE 108 (H) 01/06/2021 2127   BUN 10 01/06/2021 2127   CREATININE 0.55 01/06/2021 2127   CALCIUM 8.5 (L) 01/06/2021 2127   PROT 7.0 01/06/2021 2127   ALBUMIN 3.9 01/06/2021 2127   AST 22 01/06/2021 2127   ALT 15 01/06/2021 2127   ALKPHOS 69 01/06/2021 2127   BILITOT 0.2 (L) 01/06/2021 2127   GFRNONAA >60 01/06/2021 2127   GFRAA >60  05/04/2020 1109   No results found for: "CHOL", "HDL", "LDLCALC", "LDLDIRECT", "TRIG", "CHOLHDL" No results found for: "HGBA1C" Lab Results  Component Value Date   VITAMINB12 528 09/17/2022   Lab Results  Component Value Date   TSH 2.190 09/17/2022        No data to display               No data to display           ASSESSMENT AND PLAN  48 y.o. year old female  has a past medical history of Asthma and Migraines. here with    Migraine with aura and without status migrainosus, not intractable  Memory loss  [name] reports headaches are significantly improved. She will continue Tylenol as needed for migraine abortion. May consider wrist brace for possible CTS. Discuss symptoms with PCP if not improving. Healthy lifestyle habits encouraged. She will follow up with PCP as directed. She will return to see me as needed. She verbalizes understanding and agreement with this plan.   No orders of the defined types were placed in this encounter.    No orders of the defined types were placed in this encounter.    Shawnie Dapper, MSN, FNP-C 03/29/2024, 4:06 PM  [facility] [address], [address] [place], [place] [address] [phone]

## 2024-03-24 NOTE — Patient Instructions (Addendum)
 Below is our plan:  We will continue to monitor headaches. Continue Tylenol as as needed but do not take regularly. Please continue close follow up with your PCP. Your last A1C was elevated and considered in the prediabetes range. Please work on diet control.   Try using a carpal tunnel brace. You can get this from most pharmacies. Wear as often as possible. If pain/numbness does not improve, please let your primary care provider know as you may need to see an orthopedic provider.   Please make sure you are staying well hydrated. I recommend 50-60 ounces daily. Well balanced diet and regular exercise encouraged. Consistent sleep schedule with 6-8 hours recommended.   Please continue follow up with care team as directed.   Follow up with me as needed   You may receive a survey regarding today's visit. I encourage you to leave honest feed back as I do use this information to improve patient care. Thank you for seeing me today!   GENERAL HEADACHE INFORMATION:   Natural supplements: Magnesium Oxide or Magnesium Glycinate 500 mg at bed (up to 800 mg daily) Coenzyme Q10 300 mg in AM Vitamin B2- 200 mg twice a day   Add 1 supplement at a time since even natural supplements can have undesirable side effects. You can sometimes buy supplements cheaper (especially Coenzyme Q10) at www.WebmailGuide.co.za or at [facility].  Migraine with aura: There is increased risk for stroke in women with migraine with aura and a contraindication for the combined contraceptive pill for use by women who have migraine with aura. The risk for women with migraine without aura is lower. However other risk factors like smoking are far more likely to increase stroke risk than migraine. There is a recommendation for no smoking and for the use of OCPs without estrogen such as progestogen only pills particularly for women with migraine with aura.Marland Kitchen People who have migraine headaches with auras may be 3 times more likely to have a stroke  caused by a blood clot, compared to migraine patients who don't see auras. Women who take hormone-replacement therapy may be 30 percent more likely to suffer a clot-based stroke than women not taking medication containing estrogen. Other risk factors like smoking and high blood pressure may be  much more important.    Vitamins and herbs that show potential:   Magnesium: Magnesium (250 mg twice a day or 500 mg at bed) has a relaxant effect on smooth muscles such as blood vessels. Individuals suffering from frequent or daily headache usually have low magnesium levels which can be increase with daily supplementation of 400-750 mg. Three trials found 40-90% average headache reduction  when used as a preventative. Magnesium may help with headaches are aura, the best evidence for magnesium is for migraine with aura is its thought to stop the cortical spreading depression we believe is the pathophysiology of migraine aura.Magnesium also demonstrated the benefit in menstrually related migraine.  Magnesium is part of the messenger system in the serotonin cascade and it is a good muscle relaxant.  It is also useful for constipation which can be a side effect of other medications used to treat migraine. Good sources include nuts, whole grains, and tomatoes. Side Effects: loose stool/diarrhea  Riboflavin (vitamin B 2) 200 mg twice a day. This vitamin assists nerve cells in the production of ATP a principal energy storing molecule.  It is necessary for many chemical reactions in the body.  There have been at least 3 clinical trials of riboflavin using  400 mg per day all of which suggested that migraine frequency can be decreased.  All 3 trials showed significant improvement in over half of migraine sufferers.  The supplement is found in bread, cereal, milk, meat, and poultry.  Most Americans get more riboflavin than the recommended daily allowance, however riboflavin deficiency is not necessary for the supplements to help  prevent headache. Side effects: energizing, green urine   Coenzyme Q10: This is present in almost all cells in the body and is critical component for the conversion of energy.  Recent studies have shown that a nutritional supplement of CoQ10 can reduce the frequency of migraine attacks by improving the energy production of cells as with riboflavin.  Doses of 150 mg twice a day have been shown to be effective.   Melatonin: Increasing evidence shows correlation between melatonin secretion and headache conditions.  Melatonin supplementation has decreased headache intensity and duration.  It is widely used as a sleep aid.  Sleep is natures way of dealing with migraine.  A dose of 3 mg is recommended to start for headaches including cluster headache. Higher doses up to 15 mg has been reviewed for use in Cluster headache and have been used. The rationale behind using melatonin for cluster is that many theories regarding the cause of Cluster headache center around the disruption of the normal circadian rhythm in the brain.  This helps restore the normal circadian rhythm.   HEADACHE DIET: Foods and beverages which may trigger migraine Note that only 20% of headache patients are food sensitive. You will know if you are food sensitive if you get a headache consistently 20 minutes to 2 hours after eating a certain food. Only cut out a food if it causes headaches, otherwise you might remove foods you enjoy! What matters most for diet is to eat a well balanced healthy diet full of vegetables and low fat protein, and to not miss meals.   Chocolate, other sweets ALL cheeses except cottage and cream cheese Dairy products, yogurt, sour cream, ice cream Liver Meat extracts (Bovril, Marmite, meat tenderizers) Meats or fish which have undergone aging, fermenting, pickling or smoking. These include: Hotdogs,salami,Lox,sausage, mortadellas,smoked salmon, pepperoni, Pickled herring Pods of broad bean (English beans,  Chinese pea pods, [place] (fava) beans, lima and navy beans Ripe avocado, ripe banana Yeast extracts or active yeast preparations such as Brewer's or Fleishman's (commercial bakes goods are permitted) Tomato based foods, pizza (lasagna, etc.)   MSG (monosodium glutamate) is disguised as many things; look for these common aliases: Monopotassium glutamate Autolysed yeast Hydrolysed protein Sodium caseinate "flavorings" "all natural preservatives" Nutrasweet   Avoid all other foods that convincingly provoke headaches.   Resources: The Dizzy Adair Laundry Your Headache Diet, migrainestrong.com  https://zamora-andrews.com/   Caffeine and Migraine For patients that have migraine, caffeine intake more than 3 days per week can lead to dependency and increased migraine frequency. I would recommend cutting back on your caffeine intake as best you can. The recommended amount of caffeine is 200-300 mg daily, although migraine patients may experience dependency at even lower doses. While you may notice an increase in headache temporarily, cutting back will be helpful for headaches in the long run. For more information on caffeine and migraine, visit: https://americanmigrainefoundation.org/resource-library/caffeine-and-migraine/   Headache Prevention Strategies:   1. Maintain a headache diary; learn to identify and avoid triggers.  - This can be a simple note where you log when you had a headache, associated symptoms, and medications used - There are several smartphone  apps developed to help track migraines: Migraine Buddy, Migraine Monitor, Curelator N1-Headache App   Common triggers include: Emotional triggers: Emotional/Upset family or friends Emotional/Upset occupation Business reversal/success Anticipation anxiety Crisis-serious Post-crisis periodNew job/position   Physical triggers: Vacation Day Weekend Strenuous Exercise High Altitude  Location New Move Menstrual Day Physical Illness Oversleep/Not enough sleep Weather changes Light: Photophobia or light sesnitivity treatment involves a balance between desensitization and reduction in overly strong input. Use dark polarized glasses outside, but not inside. Avoid bright or fluorescent light, but do not dim environment to the point that going into a normally lit room hurts. Consider FL-41 tint lenses, which reduce the most irritating wavelengths without blocking too much light.  These can be obtained at axonoptics.com or theraspecs.com Foods: see list above.   2. Limit use of acute treatments (over-the-counter medications, triptans, etc.) to no more than 2 days per week or 10 days per month to prevent medication overuse headache (rebound headache).     3. Follow a regular schedule (including weekends and holidays): Don't skip meals. Eat a balanced diet. 8 hours of sleep nightly. Minimize stress. Exercise 30 minutes per day. Being overweight is associated with a 5 times increased risk of chronic migraine. Keep well hydrated and drink 6-8 glasses of water per day.   4. Initiate non-pharmacologic measures at the earliest onset of your headache. Rest and quiet environment. Relax and reduce stress. Breathe2Relax is a free app that can instruct you on    some simple relaxtion and breathing techniques. Http://Dawnbuse.com is a    free website that provides teaching videos on relaxation.  Also, there are  many apps that   can be downloaded for "mindful" relaxation.  An app called YOGA NIDRA will help walk you through mindfulness. Another app called Calm can be downloaded to give you a structured mindfulness guide with daily reminders and skill development. Headspace for guided meditation Mindfulness Based Stress Reduction Online Course: www.palousemindfulness.com Cold compresses.   5. Don't wait!! Take the maximum allowable dosage of prescribed medication at the first sign of  migraine.   6. Compliance:  Take prescribed medication regularly as directed and at the first sign of a migraine.   7. Communicate:  Call your physician when problems arise, especially if your headaches change, increase in frequency/severity, or become associated with neurological symptoms (weakness, numbness, slurred speech, etc.). Proceed to emergency room if you experience new or worsening symptoms or symptoms do not resolve, if you have new neurologic symptoms or if headache is severe, or for any concerning symptom.   8. Headache/pain management therapies: Consider various complementary methods, including medication, behavioral therapy, psychological counselling, biofeedback, massage therapy, acupuncture, dry needling, and other modalities.  Such measures may reduce the need for medications. Counseling for pain management, where patients learn to function and ignore/minimize their pain, seems to work very well.   9. Recommend changing family's attention and focus away from patient's headaches. Instead, emphasize daily activities. If first question of day is 'How are your headaches/Do you have a headache today?', then patient will constantly think about headaches, thus making them worse. Goal is to re-direct attention away from headaches, toward daily activities and other distractions.   10. Helpful Websites: www.AmericanHeadacheSociety.org PatentHood.ch www.headaches.org TightMarket.nl www.achenet.org

## 2024-03-29 ENCOUNTER — Encounter: Payer: Self-pay | Admitting: Family Medicine

## 2024-03-29 ENCOUNTER — Ambulatory Visit (INDEPENDENT_AMBULATORY_CARE_PROVIDER_SITE_OTHER): Payer: Self-pay | Admitting: Family Medicine

## 2024-03-29 VITALS — BP 115/75 | HR 80 | Ht 64.0 in | Wt 209.5 lb

## 2024-03-29 DIAGNOSIS — R413 Other amnesia: Secondary | ICD-10-CM

## 2024-03-29 DIAGNOSIS — G43109 Migraine with aura, not intractable, without status migrainosus: Secondary | ICD-10-CM

## 2024-03-31 ENCOUNTER — Ambulatory Visit (INDEPENDENT_AMBULATORY_CARE_PROVIDER_SITE_OTHER): Payer: Self-pay | Admitting: Pulmonary Disease

## 2024-03-31 ENCOUNTER — Encounter: Payer: Self-pay | Admitting: Pulmonary Disease

## 2024-03-31 VITALS — BP 113/75 | HR 87 | Temp 97.9°F | Ht 64.0 in | Wt 207.6 lb

## 2024-03-31 DIAGNOSIS — J4540 Moderate persistent asthma, uncomplicated: Secondary | ICD-10-CM

## 2024-03-31 DIAGNOSIS — K219 Gastro-esophageal reflux disease without esophagitis: Secondary | ICD-10-CM

## 2024-03-31 DIAGNOSIS — J45909 Unspecified asthma, uncomplicated: Secondary | ICD-10-CM

## 2024-03-31 MED ORDER — BUDESONIDE-FORMOTEROL FUMARATE 160-4.5 MCG/ACT IN AERO: 2.0000 | INHALATION_SPRAY | Freq: Four times a day (QID) | RESPIRATORY_TRACT | 3 refills | Status: DC

## 2024-03-31 NOTE — Progress Notes (Signed)
 "              [name] [name]    [id number]    1976/02/06  Primary Care Physician:[name]  Referring Physician: Montgomery Sherlean LABOR, Cabrera [address] [place],  [place] [address]  Chief complaint: Consult for asthma  HPI: 48 y.o. who  has a past medical history of Asthma and Migraines.  Discussed the use of AI scribe software for clinical note transcription with the patient, who gave verbal consent to proceed.  History of Present Illness [name] is a 48 year old female with asthma and bronchitis who presents for follow-up. She was referred by her primary doctor for follow-up of asthma and bronchitis.  She has been experiencing asthma and bronchitis since 2021, initially diagnosed by a specialist in [facility]. She has a history of bronchitis and has used prednisone  three times in the past year.  She experiences symptoms such as chest and back pain, especially when walking or exposed to strong odors, described as 'pressure on my right side of my chest' that radiates to her back. She also experiences palpitations, which she associates with albuterol  use.  Initially maintained on inhalers like Flovent  and later fluticasone  and salmeterol, she discontinued them due to side effects such as coughing and throat dryness. She now uses albuterol , particularly during cold weather, up to three to four times a day when symptoms worsen. Recently, she resumed using albuterol  after a two-month break.  In February of the previous year, she visited the emergency room and was diagnosed with bronchitis. She has been on prednisone  three times in the past year.  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 At home, she reports no exposure to mold or dampness, and she does not use feather pillows or comforters.  She is allergic to dust, particularly from clothes dryers, and experiences mild seasonal allergies.     Pets:  No pets Occupation: Works in a web designer with cloth Exposures: No mold, hot tub, Jacuzzi.  No feather pillows or comforters Smoking history: Never smoker Travel history: No significant recent travel history Relevant family history: No family history of lung disease  Outpatient Encounter Medications as of 03/31/2024  Medication Sig   albuterol  (VENTOLIN  HFA) 108 (90 Base) MCG/ACT inhaler Inhale into the lungs every 6 (six) hours as needed for wheezing or shortness of breath.   azithromycin  (ZITHROMAX ) 250 MG tablet Take 1 tablet (250 mg total) by mouth daily. (Patient not taking: Reported on 03/31/2024)   budesonide -formoterol  (SYMBICORT ) 160-4.5 MCG/ACT inhaler Inhale 2 puffs into the lungs 4 (four) times daily as directed   ciclopirox  (PENLAC ) 8 % solution Apply to the affected areas daily preferably at bedtime or 8 hours before washing.  Remove once a week with rubbing alcohol. (Patient not taking: Reported on 03/31/2024)   Dextromethorphan -Benzocaine (CEPACOL SORE THROAT & COUGH) 5-7.5 MG LOZG Take 1 lozenge 5 times a day by oral route. (Patient not taking: Reported on 03/31/2024)   Dextromethorphan -guaiFENesin  (CHEST CONGESTION RELIEF DM) 10-100 MG/5ML liquid Take 10 mL every 4 hours by oral route. (Patient not taking: Reported on 03/31/2024)   diclofenac  (CATAFLAM ) 50 MG tablet Take 1-2 pills as needed for migraine headache (Patient not taking: Reported on 03/31/2024)   diclofenac  Sodium (VOLTAREN ) 1 % GEL APPLY 2 GRAMS TO THE AFFECTED AREA(S) BY TOPICAL ROUTE 4 TIMES PER DAY (Patient not taking: Reported on 03/31/2024)   Doxylamine-Phenylephrine-APAP (  VICKS SINEX DAYQUIL/NYQUIL) MISC take as instructed in box (Patient not taking: Reported on 03/31/2024)   fluticasone  (FLONASE ) 50 MCG/ACT nasal spray Place 1 spray into both nostrils daily. (Patient not taking: Reported on 03/31/2024)   metFORMIN  (GLUCOPHAGE -XR) 500 MG 24 hr tablet Take 1 tablet (500 mg total) by mouth daily with a meal. (Patient  not taking: Reported on 03/31/2024)   montelukast  (SINGULAIR ) 10 MG tablet Take 1 tablet every day by oral route in the morning for 90 days. (Patient not taking: Reported on 03/31/2024)   montelukast  (SINGULAIR ) 10 MG tablet Take 1 tablet (10 mg total) by mouth every morning. (Patient not taking: Reported on 03/31/2024)   montelukast  (SINGULAIR ) 10 MG tablet Take 1 tablet (10 mg total) by mouth daily. (Patient not taking: Reported on 03/31/2024)   naproxen (NAPROSYN) 500 MG tablet naproxen 500 mg tablet  Take 1 tablet twice a day by oral route with meals for 30 days. (Patient not taking: Reported on 03/31/2024)   terbinafine  (LAMISIL ) 250 MG tablet Take 1 tablet every day by oral route with meals for 60 days. (Patient not taking: Reported on 03/31/2024)   [DISCONTINUED] budesonide -formoterol  (SYMBICORT ) 160-4.5 MCG/ACT inhaler Inhale 2 puffs into the lungs 4 (four) times daily as directed (Patient not taking: Reported on 03/31/2024)   [DISCONTINUED] fluticasone  (FLOVENT  HFA) 110 MCG/ACT inhaler Inhale 1 puff into the lungs 2 (two) times daily. (Patient not taking: Reported on 03/31/2024)   [DISCONTINUED] fluticasone -salmeterol (ADVAIR ) 250-50 MCG/ACT AEPB Inhale 1 puff into the lungs 2 (two) times daily. (Patient not taking: Reported on 03/31/2024)   No facility-administered encounter medications on file as of 03/31/2024.    Allergies as of 03/31/2024 - Review Complete 03/31/2024  Allergen Reaction Noted   Hydrocodone Rash 02/14/2015    Past Medical History:  Diagnosis Date   Asthma    Migraines     Past Surgical History:  Procedure Laterality Date   CHOLECYSTECTOMY      Family History  Problem Relation Age of Onset   Migraines Mother    Hyperlipidemia Mother    Hypertension Mother    Diabetes Father    Hypertension Father    Hyperlipidemia Father    Breast cancer Neg Hx     Social History   Socioeconomic History   Marital status: Married    Spouse name: Not on file   Number of children: 4    Years of education: Not on file   Highest education level: 9th grade  Occupational History   Not on file  Tobacco Use   Smoking status: Never   Smokeless tobacco: Never  Vaping Use   Vaping status: Never Used  Substance and Sexual Activity   Alcohol use: Not Currently    Comment: rare   Drug use: No   Sexual activity: Yes    Birth control/protection: Condom, I.U.D.  Other Topics Concern   Not on file  Social History Narrative   Lives with husband and kids   Right handed   Caffeine: hardly any, maybe a cup per week   Social Drivers of Health   Financial Resource Strain: Not at Risk (07/04/2023)   Received from [employer]    Financial Resource Strain: 1  Food Insecurity: Not at Risk (07/04/2023)   Received from [facility] Insecurity    Food: 1  Transportation Needs: Not at Risk (07/04/2023)   Received from [employer] [employer] Needs    Transportation: 1  Physical Activity: Not on  File (04/18/2022)   Received from [facility]   Physical Activity    Physical Activity: 0  Stress: Not on File (04/18/2022)   Received from [facility]   Stress    Stress: 0  Social Connections: Not on File (09/02/2023)   Received from [facility]   Social Connections    Connectedness: 0  Intimate Partner Violence: Unknown (04/05/2022)   [facility] from [facility]   HITS    Physically Hurt: Not on file    Insult or Talk Down To: Not on file    Threaten Physical Harm: Not on file    Scream or Curse: Not on file    Review of systems: Review of Systems  Constitutional: Negative for fever and chills.  HENT: Negative.   Eyes: Negative for blurred vision.  Respiratory: as per HPI  Cardiovascular: Negative for chest pain and palpitations.  Gastrointestinal: Negative for vomiting, diarrhea, blood per rectum. Genitourinary: Negative for dysuria, urgency, frequency and hematuria.  Musculoskeletal: Negative for myalgias, back pain and joint pain.  Skin: Negative for  itching and rash.  Neurological: Negative for dizziness, tremors, focal weakness, seizures and loss of consciousness.  Endo/Heme/Allergies: Negative for environmental allergies.  Psychiatric/Behavioral: Negative for depression, suicidal ideas and hallucinations.  All other systems reviewed and are negative.  Physical Exam: Blood pressure 113/75, pulse 87, temperature 97.9 F (36.6 C), height 5' 4 (1.626 m), weight 207 lb 9.6 oz (94.2 kg), SpO2 94%. Gen:      No acute distress HEENT:  EOMI, sclera anicteric Neck:     No masses; no thyromegaly Lungs:    Clear to auscultation bilaterally; normal respiratory effort CV:         Regular rate and rhythm; no murmurs Abd:      + bowel sounds; soft, non-tender; no palpable masses, no distension Ext:    No edema; adequate peripheral perfusion Skin:      Warm and dry; no rash Neuro: alert and oriented x 3 Psych: normal mood and affect  Data Reviewed: Imaging: CTA 05/04/2020-no evidence of pulmonary embolism, bilateral hazy as baseline opacities Chest x-ray 02/18/2023-no active cardiopulmonary disease I had reviewed the images personally.  PFTs:  Labs:  Assessment & Plan Asthma Asthma diagnosed in 2021 with exacerbations during cold weather and exposure to strong odors. Symptoms include chest and back pain, dyspnea, and increased albuterol  use. Previous treatments with Flovent  and fluticasone /salmeterol were ineffective, causing coughing and dryness. Current use of albuterol  as needed, with concerns about palpitations. No family history of lung disease or significant environmental exposures. Reassured that asthma does not lead to cancer. Discussed that strong odors can trigger exacerbations and that asthma can cause inflammation detectable in blood tests.  - Order blood tests to assess inflammation related to asthma. - Prescribe Symbicort  inhaler. - Continue montelukast  (Singulair ) daily. - Order pulmonary function tests for the next visit. -  Discuss potential for injection therapy if symptoms persist despite current treatment.  Bronchitis Bronchitis diagnosed in February of the previous year. No current symptoms.  Migraine Migraines with no current treatment or management plan discussed.  Acid Reflux Acid reflux previously managed with omeprazole, currently controlled without medication.  Asthma Follow-up Follow-up plans for further evaluation and management of asthma. - Schedule follow-up appointment for pulmonary function tests.  Recommendations: Check CBC with differential, IgE Start Symbicort  Continue montelukast  PFTs  Lonna Coder Cabrera [facility] 03/31/2024, 4:16 PM  CC: [name]    "

## 2024-03-31 NOTE — Patient Instructions (Signed)
 VISIT SUMMARY:  Today, you came in for a follow-up appointment to discuss your asthma and bronchitis. We reviewed your history of asthma and bronchitis, including your symptoms and previous treatments. We also touched on your experiences with migraines and acid reflux.  YOUR PLAN:  -ASTHMA: Asthma is a condition where your airways become inflamed and narrow, making it hard to breathe. You have been experiencing symptoms like chest and back pain, especially when walking or exposed to strong odors. We will order blood tests to check for inflammation, prescribe a Symbicort inhaler, and continue your daily montelukast (Singulair). We will also schedule pulmonary function tests for your next visit and discuss potential injection therapy if your symptoms persist.  -BRONCHITIS: Bronchitis is an inflammation of the bronchial tubes in your lungs. You were diagnosed with bronchitis in February of last year, but you do not have any current symptoms.  -MIGRAINE: Migraines are severe headaches often accompanied by nausea and sensitivity to light and sound. We did not discuss a treatment or management plan for your migraines today.  -ACID REFLUX: Acid reflux occurs when stomach acid flows back into your esophagus, causing heartburn. Your acid reflux is currently controlled without medication.  INSTRUCTIONS:  Please schedule a follow-up appointment for pulmonary function tests. Continue taking your montelukast (Singulair) daily and start using the Symbicort inhaler as prescribed. We will review your blood test results and discuss further treatment options at your next visit.

## 2024-04-02 ENCOUNTER — Encounter: Payer: Self-pay | Admitting: Pulmonary Disease

## 2024-04-02 LAB — CBC WITH DIFFERENTIAL/PLATELET
Basophils Absolute: 0 10*3/uL (ref 0.0–0.1)
Basophils Relative: 0.4 % (ref 0.0–3.0)
Eosinophils Absolute: 0.1 10*3/uL (ref 0.0–0.7)
Eosinophils Relative: 1.8 % (ref 0.0–5.0)
HCT: 43.1 % (ref 36.0–46.0)
Hemoglobin: 14.6 g/dL (ref 12.0–15.0)
Lymphocytes Relative: 27.7 % (ref 12.0–46.0)
Lymphs Abs: 1.6 10*3/uL (ref 0.7–4.0)
MCHC: 34 g/dL (ref 30.0–36.0)
MCV: 93.3 fl (ref 78.0–100.0)
Monocytes Absolute: 0.3 10*3/uL (ref 0.1–1.0)
Monocytes Relative: 5.8 % (ref 3.0–12.0)
Neutro Abs: 3.7 10*3/uL (ref 1.4–7.7)
Neutrophils Relative %: 64.3 % (ref 43.0–77.0)
Platelets: 195 10*3/uL (ref 150.0–400.0)
RBC: 4.62 Mil/uL (ref 3.87–5.11)
RDW: 13.2 % (ref 11.5–15.5)
WBC: 5.7 10*3/uL (ref 4.0–10.5)

## 2024-04-05 LAB — IGE: IgE (Immunoglobulin E), Serum: 102 kU/L (ref <=114)

## 2024-06-24 ENCOUNTER — Other Ambulatory Visit (HOSPITAL_COMMUNITY): Payer: Self-pay

## 2024-06-24 ENCOUNTER — Ambulatory Visit: Payer: Self-pay | Admitting: Pulmonary Disease

## 2024-06-24 VITALS — BP 106/60 | HR 88 | Ht 64.0 in | Wt 210.6 lb

## 2024-06-24 DIAGNOSIS — J4540 Moderate persistent asthma, uncomplicated: Secondary | ICD-10-CM

## 2024-06-24 LAB — PULMONARY FUNCTION TEST
DL/VA % pred: 137 %
DL/VA: 6.02 ml/min/mmHg/L
DLCO unc % pred: 110 %
DLCO unc: 23.74 ml/min/mmHg
FEF 25-75 Post: 2.72 L/s
FEF 25-75 Pre: 2.16 L/s
FEF2575-%Change-Post: 25 %
FEF2575-%Pred-Post: 92 %
FEF2575-%Pred-Pre: 73 %
FEV1-%Change-Post: 8 %
FEV1-%Pred-Post: 92 %
FEV1-%Pred-Pre: 85 %
FEV1-Post: 2.69 L
FEV1-Pre: 2.49 L
FEV1FVC-%Change-Post: 8 %
FEV1FVC-%Pred-Pre: 94 %
FEV6-%Change-Post: 0 %
FEV6-%Pred-Post: 91 %
FEV6-%Pred-Pre: 91 %
FEV6-Post: 3.23 L
FEV6-Pre: 3.24 L
FEV6FVC-%Change-Post: 0 %
FEV6FVC-%Pred-Post: 102 %
FEV6FVC-%Pred-Pre: 101 %
FVC-%Change-Post: 0 %
FVC-%Pred-Post: 89 %
FVC-%Pred-Pre: 89 %
FVC-Post: 3.23 L
FVC-Pre: 3.25 L
Post FEV1/FVC ratio: 83 %
Post FEV6/FVC ratio: 100 %
Pre FEV1/FVC ratio: 77 %
Pre FEV6/FVC Ratio: 100 %
RV % pred: 99 %
RV: 1.67 L
TLC % pred: 98 %
TLC: 4.97 L

## 2024-06-24 MED ORDER — BUDESONIDE-FORMOTEROL FUMARATE 160-4.5 MCG/ACT IN AERO
2.0000 | INHALATION_SPRAY | Freq: Two times a day (BID) | RESPIRATORY_TRACT | 3 refills | Status: DC
  Filled 2024-06-24: qty 30.6, 90d supply, fill #0

## 2024-06-24 NOTE — Patient Instructions (Signed)
 VISIT SUMMARY:  Today, we discussed your asthma management and concerns about throat irritation from your inhaler. We reviewed your symptoms, inhaler usage, and recent test results.  YOUR PLAN:  -ASTHMA: Asthma is a condition where your airways narrow and swell, making it difficult to breathe. Your symptoms include throat irritation and difficulty breathing, especially in hot, enclosed spaces. We will adjust your Symbicort  usage to two puffs in the morning and two in the evening. A spacer will be provided to help with medication delivery and reduce throat irritation. Continue using montelukast  and albuterol  as needed. Avoid strong fragrances and other asthma triggers.  INSTRUCTIONS:  Please follow the new Symbicort  usage instructions: two puffs in the morning and two in the evening. Use the spacer provided with both Symbicort  and albuterol . Continue taking montelukast  and use albuterol  as needed. Avoid strong fragrances and other asthma triggers. If you have any questions or if your symptoms do not improve, please schedule a follow-up appointment.

## 2024-06-24 NOTE — Progress Notes (Signed)
 Full pft performed today.

## 2024-06-24 NOTE — Progress Notes (Signed)
 Koya Hunger Deyjah Kindel    [id number]    04-25-1976  Primary Care Physician:Jasper, Sherlean LABOR, MD  Referring Physician: Montgomery Sherlean LABOR, MD [address] [place],  [place] [address]  Chief complaint:  Follow-up for asthma  HPI: 48 y.o. who  has a past medical history of Asthma and Migraines.  Discussed the use of AI scribe software for clinical note transcription with the patient, who gave verbal consent to proceed.  History of Present Illness Ingeborg Fite Leos is a 48 y.o. with asthma and bronchitis who presents for follow-up. She was referred by her primary doctor for follow-up of asthma and bronchitis.  She has been experiencing asthma and bronchitis since 2021, initially diagnosed by a specialist in [facility]. She has a history of bronchitis and has used prednisone  three times in the past year.  She experiences symptoms such as chest and back pain, especially when walking or exposed to strong odors, described as 'pressure on my right side of my chest' that radiates to her back. She also experiences palpitations, which she associates with albuterol  use.  Initially maintained on inhalers like Flovent  and later fluticasone  and salmeterol, she discontinued them due to side effects such as coughing and throat dryness. She now uses albuterol , particularly during cold weather, up to three to four times a day when symptoms worsen. Recently, she resumed using albuterol  after a two-month break.  In February of the previous year, she visited the emergency room and was diagnosed with bronchitis. She has been on prednisone  three times in the past year.  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 At home, she reports no exposure to mold or dampness, and she does not use feather pillows or comforters.  She is allergic to dust, particularly from clothes dryers, and experiences mild seasonal allergies.  Interim  History: Shakeerah Gradel is a 48 year old female with asthma who presents with concerns about her inhaler use and throat irritation.  She has been using Symbicort , which was started recently, and feels it has slightly improved her breathing. However, she experiences significant throat dryness and irritation, which she attributes to the inhaler. She has been using the inhaler four times a day, but the correct usage is two puffs in the morning and two in the evening. Despite rinsing her mouth after use, she still experiences throat discomfort, especially when drinking water, as it feels like her throat is closing up.  She is also using montelukast  and albuterol  as a rescue inhaler. In the past four weeks, she has used albuterol  six times, primarily when experiencing difficulty breathing or chest pressure. Her breathing difficulties are often accompanied by pain on the right side of her chest or back, particularly in hot, enclosed spaces.  No problems with sleep, snoring, or being told she stops breathing at night. She also denies regular acid reflux, though she experiences it occasionally with spicy foods. She has a history of using medication for acid reflux years ago but is not currently on any.  She recently moved into her current home and discovered mold behind the bathtub.  Pets: No pets Occupation: Works in a Web designer with cloth Exposures: No mold, hot tub, Jacuzzi.  No feather pillows or comforters Smoking history: Never smoker Travel history: No significant recent travel history Relevant family history: No family history of lung disease  Outpatient Encounter Medications as of 06/24/2024  Medication Sig   albuterol  (VENTOLIN  HFA) 108 (90 Base) MCG/ACT inhaler Inhale into the lungs every 6 (six) hours as needed for wheezing or shortness of breath.   azithromycin  (ZITHROMAX ) 250 MG tablet Take 1 tablet (250 mg total) by mouth daily.   budesonide -formoterol   (SYMBICORT ) 160-4.5 MCG/ACT inhaler Inhale 2 puffs into the lungs 4 (four) times daily as directed   ciclopirox  (PENLAC ) 8 % solution Apply to the affected areas daily preferably at bedtime or 8 hours before washing.  Remove once a week with rubbing alcohol.   Dextromethorphan -Benzocaine (CEPACOL SORE THROAT & COUGH) 5-7.5 MG LOZG Take 1 lozenge 5 times a day by oral route.   Dextromethorphan -guaiFENesin  (CHEST CONGESTION RELIEF DM) 10-100 MG/5ML liquid Take 10 mL every 4 hours by oral route.   diclofenac  (CATAFLAM ) 50 MG tablet Take 1-2 pills as needed for migraine headache   diclofenac  Sodium (VOLTAREN ) 1 % GEL APPLY 2 GRAMS TO THE AFFECTED AREA(S) BY TOPICAL ROUTE 4 TIMES PER DAY   Doxylamine-Phenylephrine-APAP (VICKS SINEX DAYQUIL/NYQUIL) MISC take as instructed in box   fluticasone  (FLONASE ) 50 MCG/ACT nasal spray Place 1 spray into both nostrils daily.   metFORMIN  (GLUCOPHAGE -XR) 500 MG 24 hr tablet Take 1 tablet (500 mg total) by mouth daily with a meal.   montelukast  (SINGULAIR ) 10 MG tablet Take 1 tablet every day by oral route in the morning for 90 days.   montelukast  (SINGULAIR ) 10 MG tablet Take 1 tablet (10 mg total) by mouth every morning.   montelukast  (SINGULAIR ) 10 MG tablet Take 1 tablet (10 mg total) by mouth daily.   naproxen (NAPROSYN) 500 MG tablet naproxen 500 mg tablet  Take 1 tablet twice a day by oral route with meals for 30 days.   terbinafine  (LAMISIL ) 250 MG tablet Take 1 tablet every day by oral route with meals for 60 days.   No facility-administered encounter medications on file as of 06/24/2024.   Physical Exam: Blood pressure 106/60, pulse 88, height 5' 4 (1.626 m), weight 210 lb 9.6 oz (95.5 kg), SpO2 94%. Gen:      No acute distress HEENT:  EOMI, sclera anicteric Neck:     No masses; no thyromegaly Lungs:    Clear to auscultation bilaterally; normal respiratory effort CV:         Regular rate and rhythm; no murmurs Abd:      + bowel sounds; soft,  non-tender; no palpable masses, no distension Ext:    No edema; adequate peripheral perfusion Neuro: alert and oriented x 3 Psych: normal mood and affect   Data Reviewed: Imaging: CTA 05/04/2020-no evidence of pulmonary embolism, bilateral hazy as baseline opacities Chest x-ray 02/18/2023-no active cardiopulmonary disease I had reviewed the images personally.  PFTs: 06/24/2024 FVC 3.23 [89%], FEV1 2.69 [92%], F/F83, TLC 4.97 [98%], DLCO 23.74 [110%] Normal test  Labs: CBC with differential 04/02/2024- WBC 5.7, eos 1.8%, absolute eosinophil count IgE 04/02/2024-102 Assessment & Plan Asthma Asthma diagnosed in 2021 with exacerbations during cold weather and exposure to strong odors. Symptoms include chest and back pain, dyspnea, and increased albuterol  use. Previous treatments with Flovent  and fluticasone /salmeterol were ineffective, causing coughing and dryness.  Started on Symbicort  at last visit with some improvement in symptoms  Has symptoms of throat irritation and dyspnea, particularly in hot and enclosed spaces. Symptoms include pressure and discomfort on the right side of the chest and back during dyspnea. Recent blood and pulmonary function tests are normal, indicating no lung damage. Symbicort  is causing throat irritation, likely  due to incorrect usage. Minimal mold exposure at home is unlikely to be a significant factor.  - Change Symbicort  prescription to two puffs in the morning and two puffs in the evening. - Provide a spacer for use with Symbicort  and albuterol  to improve medication delivery and reduce throat irritation. - Educate on proper inhaler technique, including exhaling through the nose to retain medication. - Continue montelukast  and albuterol  as needed. - Advise to avoid strong fragrances and other asthma triggers.  Acid Reflux Acid reflux previously managed with omeprazole, currently controlled without medication.   Recommendations: Continue Symbicort  with spacer,  montelukast   Stephano Arrants MD [facility] 06/24/2024, 4:18 PM  CC: Montgomery Sherlean LABOR, MD

## 2024-06-24 NOTE — Patient Instructions (Signed)
 Full pft performed today.

## 2024-07-06 ENCOUNTER — Other Ambulatory Visit (HOSPITAL_COMMUNITY): Payer: Self-pay

## 2024-07-27 ENCOUNTER — Other Ambulatory Visit: Payer: Self-pay

## 2024-07-27 DIAGNOSIS — N644 Mastodynia: Secondary | ICD-10-CM

## 2024-08-11 ENCOUNTER — Other Ambulatory Visit (HOSPITAL_COMMUNITY): Payer: Self-pay

## 2024-08-11 MED ORDER — HYDROCORTISONE 2.5 % EX CREA
TOPICAL_CREAM | Freq: Two times a day (BID) | CUTANEOUS | 3 refills | Status: AC
  Filled 2024-08-11 (×2): qty 30, 7d supply, fill #0

## 2024-08-13 ENCOUNTER — Other Ambulatory Visit (HOSPITAL_COMMUNITY): Payer: Self-pay

## 2024-08-20 ENCOUNTER — Other Ambulatory Visit (HOSPITAL_COMMUNITY): Payer: Self-pay

## 2024-09-01 ENCOUNTER — Other Ambulatory Visit (HOSPITAL_COMMUNITY): Payer: Self-pay

## 2024-09-01 MED ORDER — FLUTICASONE PROPIONATE 50 MCG/ACT NA SUSP
NASAL | 11 refills | Status: DC
  Filled 2024-09-01: qty 16, 30d supply, fill #0

## 2024-09-02 ENCOUNTER — Other Ambulatory Visit (HOSPITAL_COMMUNITY): Payer: Self-pay

## 2024-09-02 ENCOUNTER — Ambulatory Visit: Payer: Self-pay | Admitting: *Deleted

## 2024-09-02 ENCOUNTER — Ambulatory Visit
Admission: RE | Admit: 2024-09-02 | Discharge: 2024-09-02 | Disposition: A | Discharge status: Home / Self Care | Payer: Self-pay | Source: Ambulatory Visit | Attending: Obstetrics and Gynecology | Admitting: Obstetrics and Gynecology

## 2024-09-02 VITALS — BP 108/79 | Wt 203.0 lb

## 2024-09-02 DIAGNOSIS — N644 Mastodynia: Secondary | ICD-10-CM

## 2024-09-02 DIAGNOSIS — Z1239 Encounter for other screening for malignant neoplasm of breast: Secondary | ICD-10-CM

## 2024-09-02 NOTE — Progress Notes (Signed)
 [name] is a 48 y.o. female who presents to [facility] clinic today with complaint of left outer breast pain x 10 years that has increased over the last three months. Patient states the pain comes and goes. Patient rates the pain at a 8 out of 10.    Pap Smear: Pap smear not completed today. Last Pap smear was 07/03/2021 at [facility] clinic and was normal with negative HPV. Per patient has history of an abnormal Pap smear 04/03/2020 that was ASCUS with negative HPV that her most recent Pap smear was her follow up and in 2016 that a repeat Pap smear was completed for follow-up that was normal. Last Pap smear result is available in Epic.   Physical exam: Breasts Breasts symmetrical. No skin abnormalities bilateral breasts. No nipple retraction bilateral breasts. No nipple discharge bilateral breasts. No lymphadenopathy. No lumps palpated bilateral breasts. No complaints of pain or tenderness on exam. Patient denied pain on exam today.     MS DIGITAL SCREENING TOMO BILATERAL Result Date: 12/20/2022 CLINICAL DATA:  Screening. EXAM: DIGITAL SCREENING BILATERAL MAMMOGRAM WITH TOMOSYNTHESIS AND CAD TECHNIQUE: Bilateral screening digital craniocaudal and mediolateral oblique mammograms were obtained. Bilateral screening digital breast tomosynthesis was performed. The images were evaluated with computer-aided detection. COMPARISON:  Previous exam(s). ACR Breast Density Category b: There are scattered areas of fibroglandular density. FINDINGS: There are no findings suspicious for malignancy. IMPRESSION: No mammographic evidence of malignancy. A result letter of this screening mammogram will be mailed directly to the patient. RECOMMENDATION: Screening mammogram in one year. (Code:SM-B-01Y) BI-RADS CATEGORY  1: Negative. Electronically Signed   By: Reyes Phi M.D.   On: 12/20/2022 16:24   MM DIAG BREAST TOMO UNI LEFT Result Date: 08/06/2021 CLINICAL DATA:  Screening recall for a possible left breast  asymmetry. EXAM: DIGITAL DIAGNOSTIC UNILATERAL LEFT MAMMOGRAM WITH TOMOSYNTHESIS AND CAD TECHNIQUE: Left digital diagnostic mammography and breast tomosynthesis was performed. The images were evaluated with computer-aided detection. COMPARISON:  Previous exam(s). ACR Breast Density Category b: There are scattered areas of fibroglandular density. FINDINGS: On the diagnostic spot-compression images, the possible asymmetry noted in the left breast on the current screening study disperses consistent with normal fibroglandular tissue. There is no underlying mass, architectural distortion or significant residual asymmetry. There are no suspicious calcifications. IMPRESSION: No evidence of breast malignancy. RECOMMENDATION: Screening mammogram in one year.(Code:SM-B-01Y) I have discussed the findings and recommendations with the patient. If applicable, a reminder letter will be sent to the patient regarding the next appointment. BI-RADS CATEGORY  1: Negative. Electronically Signed   By: Alm Parkins M.D.   On: 08/06/2021 15:52   MS DIGITAL SCREENING TOMO BILATERAL Result Date: 07/10/2021 CLINICAL DATA:  Screening. EXAM: DIGITAL SCREENING BILATERAL MAMMOGRAM WITH TOMOSYNTHESIS AND CAD TECHNIQUE: Bilateral screening digital craniocaudal and mediolateral oblique mammograms were obtained. Bilateral screening digital breast tomosynthesis was performed. The images were evaluated with computer-aided detection. COMPARISON:  Previous exam(s). ACR Breast Density Category b: There are scattered areas of fibroglandular density. FINDINGS: In the left breast, a possible asymmetry warrants further evaluation. In the right breast, no findings suspicious for malignancy. IMPRESSION: Further evaluation is suggested for possible asymmetry in the left breast. RECOMMENDATION: Diagnostic mammogram and possibly ultrasound of the left breast. (Code:[id number]) The patient will be contacted regarding the findings, and additional imaging will be  scheduled. BI-RADS CATEGORY  0: Incomplete. Need additional imaging evaluation and/or prior mammograms for comparison. Electronically Signed   By: Bard Moats M.D.   On: 07/10/2021 13:30  MS DIGITAL SCREENING TOMO BILATERAL Result Date: 04/06/2020 CLINICAL DATA:  Screening. EXAM: DIGITAL SCREENING BILATERAL MAMMOGRAM WITH TOMO AND CAD COMPARISON:  Previous exam(s). ACR Breast Density Category b: There are scattered areas of fibroglandular density. FINDINGS: There are no findings suspicious for malignancy. Images were processed with CAD. IMPRESSION: No mammographic evidence of malignancy. A result letter of this screening mammogram will be mailed directly to the patient. RECOMMENDATION: Screening mammogram in one year. (Code:[id number]-01Y) BI-RADS CATEGORY  1: Negative. Electronically Signed   By: Dobrinka  Dimitrova M.D.   On: 04/06/2020 12:43    Pelvic/Bimanual Pap is not indicated today per BCCCP guidelines.   Smoking History: Patient has never smoked.   Patient Navigation: Patient education provided. Access to services provided for patient through [facility] program. Spanish interpreter Bernice Angry from [facility] provided.   Colorectal Cancer Screening: Per patient has had colonoscopy completed on February 2023 in [place]. No complaints today.    Breast and Cervical Cancer Risk Assessment: Patient does not have family history of breast cancer, known genetic mutations, or radiation treatment to the chest before age 21. Patient does not have history of cervical dysplasia, immunocompromised, or DES exposure in-utero.  Risk Scores as of Encounter on 09/02/2024     [place] as of 12/19/2022           5-year 0.75%   Lifetime 9.53%   This patient is [place]/Latina but has no documented birth country, so the [employer] model used data from [place] patients to calculate their risk score. Document a birth country in the Demographics activity for a more accurate score.         Last calculated by Silas, Ansyi  K, CMA on 12/19/2022 at 10:54 AM        A: BCCCP exam without pap smear Complaint of left breast pain.  P: Referred patient to the [facility] for a diagnostic mammogram. Appointment scheduled Thursday, September 02, 2024 at 1450.  Driscilla Wanda SQUIBB, RN 09/02/2024 1:06 PM

## 2024-09-02 NOTE — Patient Instructions (Signed)
 Explained breast self awareness with Hadassah Marts Nancye Leos. Patient did not need a Pap smear today due to last Pap smear and HPV typing was 07/03/2021. Let her know BCCCP will cover Pap smears and HPV typing every 5 years unless has a history of abnormal Pap smears. Referred patient to the [facility] for a diagnostic mammogram. Appointment scheduled Thursday, September 02, 2024 at 1450. Patient aware of appointment and will be there. Hadassah Marts Hebreo Leos verbalized understanding.  Huxton Glaus, Wanda Ship, RN 1:06 PM

## 2024-09-13 ENCOUNTER — Other Ambulatory Visit (HOSPITAL_COMMUNITY): Payer: Self-pay

## 2024-09-13 MED ORDER — VITAMIN D (ERGOCALCIFEROL) 1.25 MG (50000 UNIT) PO CAPS
50000.0000 [IU] | ORAL_CAPSULE | ORAL | 0 refills | Status: AC
  Filled 2024-09-13 – 2024-09-30 (×2): qty 8, 56d supply, fill #0

## 2024-09-23 ENCOUNTER — Other Ambulatory Visit (HOSPITAL_COMMUNITY): Payer: Self-pay

## 2024-09-30 ENCOUNTER — Other Ambulatory Visit (HOSPITAL_COMMUNITY): Payer: Self-pay
